# Patient Record
Sex: Female | Born: 1970 | Race: White | Hispanic: No | Marital: Married | Smoking: Former smoker
Health system: Southern US, Community
[De-identification: ages and names within clinical notes are randomized; demographics above are authoritative.]

## PROBLEM LIST (undated history)

## (undated) DIAGNOSIS — F439 Reaction to severe stress, unspecified: Secondary | ICD-10-CM

## (undated) DIAGNOSIS — I10 Essential (primary) hypertension: Secondary | ICD-10-CM

## (undated) HISTORY — PX: GALLBLADDER SURGERY: SHX652

## (undated) HISTORY — DX: Reaction to severe stress, unspecified: F43.9

---

## 1998-06-25 ENCOUNTER — Other Ambulatory Visit: Admission: RE | Admit: 1998-06-25 | Discharge: 1998-06-25 | Payer: Self-pay | Admitting: Obstetrics and Gynecology

## 1999-01-25 ENCOUNTER — Emergency Department (HOSPITAL_COMMUNITY): Admission: EM | Admit: 1999-01-25 | Discharge: 1999-01-25 | Payer: Self-pay | Admitting: Emergency Medicine

## 1999-05-14 ENCOUNTER — Other Ambulatory Visit: Admission: RE | Admit: 1999-05-14 | Discharge: 1999-05-14 | Payer: Self-pay | Admitting: *Deleted

## 2000-11-30 ENCOUNTER — Other Ambulatory Visit: Admission: RE | Admit: 2000-11-30 | Discharge: 2000-11-30 | Payer: Self-pay | Admitting: Obstetrics and Gynecology

## 2001-02-02 ENCOUNTER — Encounter: Payer: Self-pay | Admitting: Obstetrics and Gynecology

## 2001-02-02 ENCOUNTER — Ambulatory Visit (HOSPITAL_COMMUNITY): Admission: RE | Admit: 2001-02-02 | Discharge: 2001-02-02 | Payer: Self-pay | Admitting: Obstetrics and Gynecology

## 2001-04-11 ENCOUNTER — Encounter: Payer: Self-pay | Admitting: Obstetrics and Gynecology

## 2001-04-11 ENCOUNTER — Ambulatory Visit (HOSPITAL_COMMUNITY): Admission: RE | Admit: 2001-04-11 | Discharge: 2001-04-11 | Payer: Self-pay | Admitting: Obstetrics and Gynecology

## 2001-06-17 ENCOUNTER — Inpatient Hospital Stay (HOSPITAL_COMMUNITY): Admission: AD | Admit: 2001-06-17 | Discharge: 2001-06-19 | Payer: Self-pay | Admitting: Obstetrics and Gynecology

## 2001-11-23 ENCOUNTER — Other Ambulatory Visit: Admission: RE | Admit: 2001-11-23 | Discharge: 2001-11-23 | Payer: Self-pay | Admitting: Obstetrics and Gynecology

## 2002-04-06 ENCOUNTER — Encounter: Payer: Self-pay | Admitting: Obstetrics and Gynecology

## 2002-04-06 ENCOUNTER — Ambulatory Visit (HOSPITAL_COMMUNITY): Admission: RE | Admit: 2002-04-06 | Discharge: 2002-04-06 | Payer: Self-pay | Admitting: Obstetrics and Gynecology

## 2002-07-27 ENCOUNTER — Inpatient Hospital Stay (HOSPITAL_COMMUNITY): Admission: AD | Admit: 2002-07-27 | Discharge: 2002-07-28 | Payer: Self-pay | Admitting: Obstetrics and Gynecology

## 2002-11-30 ENCOUNTER — Other Ambulatory Visit: Admission: RE | Admit: 2002-11-30 | Discharge: 2002-11-30 | Payer: Self-pay | Admitting: Obstetrics and Gynecology

## 2004-01-17 ENCOUNTER — Observation Stay (HOSPITAL_COMMUNITY): Admission: EM | Admit: 2004-01-17 | Discharge: 2004-01-18 | Payer: Self-pay | Admitting: Emergency Medicine

## 2004-01-17 ENCOUNTER — Encounter (INDEPENDENT_AMBULATORY_CARE_PROVIDER_SITE_OTHER): Payer: Self-pay | Admitting: Specialist

## 2004-02-24 ENCOUNTER — Other Ambulatory Visit: Admission: RE | Admit: 2004-02-24 | Discharge: 2004-02-24 | Payer: Self-pay | Admitting: Obstetrics and Gynecology

## 2004-03-09 ENCOUNTER — Encounter (INDEPENDENT_AMBULATORY_CARE_PROVIDER_SITE_OTHER): Payer: Self-pay | Admitting: *Deleted

## 2004-03-09 LAB — CONVERTED CEMR LAB

## 2004-03-25 ENCOUNTER — Other Ambulatory Visit: Admission: RE | Admit: 2004-03-25 | Discharge: 2004-03-25 | Payer: Self-pay | Admitting: Obstetrics and Gynecology

## 2004-04-03 ENCOUNTER — Ambulatory Visit (HOSPITAL_COMMUNITY): Admission: RE | Admit: 2004-04-03 | Discharge: 2004-04-03 | Payer: Self-pay | Admitting: Obstetrics and Gynecology

## 2004-08-21 ENCOUNTER — Inpatient Hospital Stay (HOSPITAL_COMMUNITY): Admission: AD | Admit: 2004-08-21 | Discharge: 2004-08-23 | Payer: Self-pay | Admitting: Obstetrics and Gynecology

## 2004-11-14 ENCOUNTER — Ambulatory Visit: Payer: Self-pay | Admitting: Family Medicine

## 2004-11-28 ENCOUNTER — Ambulatory Visit: Payer: Self-pay | Admitting: Sports Medicine

## 2004-11-29 ENCOUNTER — Ambulatory Visit (HOSPITAL_BASED_OUTPATIENT_CLINIC_OR_DEPARTMENT_OTHER): Admission: RE | Admit: 2004-11-29 | Discharge: 2004-11-29 | Payer: Self-pay | Admitting: Sports Medicine

## 2004-11-29 ENCOUNTER — Ambulatory Visit: Payer: Self-pay | Admitting: Internal Medicine

## 2004-12-31 ENCOUNTER — Ambulatory Visit: Payer: Self-pay | Admitting: Family Medicine

## 2005-02-06 ENCOUNTER — Ambulatory Visit: Payer: Self-pay | Admitting: Sports Medicine

## 2005-02-25 ENCOUNTER — Ambulatory Visit: Payer: Self-pay | Admitting: Family Medicine

## 2005-03-20 ENCOUNTER — Ambulatory Visit: Payer: Self-pay | Admitting: Family Medicine

## 2006-02-07 LAB — CONVERTED CEMR LAB: Pap Smear: NORMAL

## 2006-02-08 ENCOUNTER — Encounter (INDEPENDENT_AMBULATORY_CARE_PROVIDER_SITE_OTHER): Payer: Self-pay | Admitting: Family Medicine

## 2007-01-07 ENCOUNTER — Encounter (INDEPENDENT_AMBULATORY_CARE_PROVIDER_SITE_OTHER): Payer: Self-pay | Admitting: *Deleted

## 2007-03-09 ENCOUNTER — Ambulatory Visit: Payer: Self-pay | Admitting: Family Medicine

## 2007-03-09 DIAGNOSIS — E669 Obesity, unspecified: Secondary | ICD-10-CM

## 2007-03-16 ENCOUNTER — Ambulatory Visit (HOSPITAL_COMMUNITY): Admission: RE | Admit: 2007-03-16 | Discharge: 2007-03-16 | Payer: Self-pay | Admitting: Internal Medicine

## 2007-04-12 ENCOUNTER — Ambulatory Visit: Payer: Self-pay | Admitting: *Deleted

## 2007-04-12 ENCOUNTER — Encounter (INDEPENDENT_AMBULATORY_CARE_PROVIDER_SITE_OTHER): Payer: Self-pay | Admitting: Family Medicine

## 2007-04-12 LAB — CONVERTED CEMR LAB
BUN: 11 mg/dL (ref 6–23)
CO2: 24 meq/L (ref 19–32)
Calcium: 9.2 mg/dL (ref 8.4–10.5)
Chloride: 101 meq/L (ref 96–112)
Cholesterol: 170 mg/dL (ref 0–200)
Glucose, Bld: 79 mg/dL (ref 70–99)
HCT: 36.6 %
Hemoglobin: 12.4 g/dL
Iron: 46 ug/dL (ref 42–145)
MCV: 86.8 fL
Platelets: 365 10*3/uL
RBC: 4.21 M/uL
TSH: 1.264 microintl units/mL (ref 0.350–5.50)
Total CHOL/HDL Ratio: 3.5
WBC: 7.4 10*3/uL

## 2007-04-20 ENCOUNTER — Ambulatory Visit: Payer: Self-pay | Admitting: Sports Medicine

## 2007-07-28 ENCOUNTER — Telehealth (INDEPENDENT_AMBULATORY_CARE_PROVIDER_SITE_OTHER): Payer: Self-pay | Admitting: *Deleted

## 2007-09-22 ENCOUNTER — Encounter (INDEPENDENT_AMBULATORY_CARE_PROVIDER_SITE_OTHER): Payer: Self-pay | Admitting: Family Medicine

## 2008-01-03 ENCOUNTER — Ambulatory Visit: Payer: Self-pay | Admitting: Family Medicine

## 2010-03-21 ENCOUNTER — Ambulatory Visit: Payer: Self-pay | Admitting: Family Medicine

## 2010-03-21 ENCOUNTER — Encounter: Payer: Self-pay | Admitting: Family Medicine

## 2010-03-24 ENCOUNTER — Ambulatory Visit: Payer: Self-pay | Admitting: Family Medicine

## 2010-03-24 LAB — CONVERTED CEMR LAB
ALT: 23 units/L (ref 0–35)
AST: 22 units/L (ref 0–37)
BUN: 9 mg/dL (ref 6–23)
CO2: 21 meq/L (ref 19–32)
Cholesterol: 147 mg/dL (ref 0–200)
Creatinine, Ser: 0.6 mg/dL (ref 0.40–1.20)
Glucose, Bld: 92 mg/dL (ref 70–99)
HCT: 38.6 % (ref 36.0–46.0)
Hemoglobin: 12.1 g/dL (ref 12.0–15.0)
LDL Cholesterol: 77 mg/dL (ref 0–99)
MCHC: 31.3 g/dL (ref 30.0–36.0)
Potassium: 4.3 meq/L (ref 3.5–5.3)
RBC: 4.38 M/uL (ref 3.87–5.11)
Total CHOL/HDL Ratio: 3.3
Triglycerides: 131 mg/dL (ref <150)
VLDL: 26 mg/dL (ref 0–40)

## 2010-03-25 LAB — CONVERTED CEMR LAB: Pap Smear: NEGATIVE

## 2010-12-11 NOTE — Assessment & Plan Note (Signed)
Summary: READ PPD/KH  Nurse Visit   Allergies: No Known Drug Allergies  PPD Results    Date of reading: 03/24/2010    Results: < 5mm    Interpretation: negative  Orders Added: 1)  No Charge Patient Arrived (NCPA0) [NCPA0] 

## 2010-12-11 NOTE — Assessment & Plan Note (Signed)
Summary: 40yo F wellness exam   Vital Signs:  Patient profile:   40 year old female Height:      63.25 inches Weight:      265.7 pounds BMI:     46.86 Temp:     97.2 degrees F Pulse rate:   76 / minute BP sitting:   157 / 96  (right arm)  Vitals Entered By: Starleen Blue RN (Mar 21, 2010 8:45 AM)  Serial Vital Signs/Assessments:  Time      Position  BP       Pulse  Resp  Temp     By                     138/                           Marisue Ivan  MD  Comments: Unable to auscultate accurately BP obtain per palpation By: Marisue Ivan  MD   CC: fatigue Is Patient Diabetic? No Pain Assessment Patient in pain? no        Primary Care Provider:  Marisue Ivan  MD  CC:  fatigue.  History of Present Illness: 40yo F here for routine wellness visit  Concerns: Weight and chronic fatigue.  States that she had lost 35lbs in 2009 with exercise and diet but has since quit her routine and has gained all of the weight back and more.  Now she continues to feel extrememly tired but admits that her activity level is very low.  No hx of temperature instability or chronic pain.  No hx o bleeding, dark stools, fevers, night sweats.    Preventative: States she had a mammogram 3-4 years ago b/c she was concerned b/c she didn't know her family hx and friends were dx with breast cancer.  States that mammogram was nl.  No recent findings of breast mass or nodules.  Due for pap smear.  No hx of abnl paps.  Up to date on vaccinations.  Habits & Providers  Alcohol-Tobacco-Diet     Tobacco Status: quit > 6 months  Current Medications (verified): 1)  None  Allergies (verified): No Known Drug Allergies  Past History:  Past Medical History: Obesity Chronic fatigue  Past Surgical History: Cholecystectomy - 01/08/2004   Family History: Unk b/c adopted  Social History: Lives with husband, four children.   Husband is recovering alcoholic.   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   Stable and supportive environment.   No EtOH, tobacco, or recreational drugs. No regular exercise at this time.Smoking Status:  quit > 6 months  Review of Systems      See HPI  Physical Exam  General:  VS reviewed and rechecked.  Morbidly obese, NAD. Eyes:  No corneal or conjunctival inflammation noted. EOMI. Perrla. Funduscopic exam benign, without hemorrhages, exudates or papilledema. Vision grossly normal. Mouth:  Oral mucosa and oropharynx without lesions or exudates.  Teeth in good repair. Neck:  supple, full ROM, no goiter or mass  Lungs:  Normal respiratory effort, chest expands symmetrically. Lungs are clear to auscultation, no crackles or wheezes. Heart:  Normal rate and regular rhythm. S1 and S2 normal without gallop, murmur, click, rub or other extra sounds. Abdomen:  Soft, obese, NT, ND, no HSM, active BS  Genitalia:  Pelvic Exam:        External: normal female genitalia without lesions or masses  Vagina: normal without lesions or masses        Cervix: normal without lesions or masses        Adnexa: normal bimanual exam without masses or fullness        Uterus: normal by palpation        Pap smear: performed Msk:  no joint effusions or erythema Extremities:  no peripheral edema Neurologic:  no focal deficits Skin:  no new lesions Cervical Nodes:  no lymphadenopathy   Impression & Recommendations:  Problem # 1:  HEALTH MAINTENANCE EXAM (ICD-V70.0) Assessment Unchanged Pap smear performed w/ pelvic exam. Pt declines mammogram at this time. Check CMET and Lipid panel to r/o HLD and DM given risk factors (obesity).  Orders: Pap Smear-[facility] ([id number]) [facility] - Est  40-64 yrs ([id number])  Problem # 2:  FATIGUE, CHRONIC (ICD-780.79) Assessment: Unchanged Continues to experience chronic fatigue. Likely multifactorial but mainly due to sedentary life style. Discussed exercise regimen. Will check CBC for anemia and TSH and Sed  rate.  Orders: Sed Rate (ESR)-[facility] [id number]) Comp Met-[facility] [phone]) TSH-[facility] [id number]) CBC-[facility] ([id number])  Problem # 3:  OBESITY NOS (ICD-278.00) Assessment: Deteriorated Pt is morbidly obese.  BMI 46. Discussed exercise regimen. Will schedule for nutrition clinic next month.  Orders: Lipid-[facility] ([id number])  Patient Instructions: 1)  Schedule appt with Dr. Burnadette Pop in nutrition clinic in June on a Thursday afternoon (9th, 16th, or 23rd). 2)  Goal for wt loss is 1/2lb- 1lbper week. 3)  I will call you if there are any abnormal lab results.  Appended Document: 40yo F wellness exam   PPD Application    Vaccine Type: PPD    Site: left forearm    Mfr: Sanofi Pasteur    Dose: 0.1 ml    Given by: Starleen Blue RN    Exp. Date: 08/21/2011    Lot #: [id number]     Vision Screening:Left eye with correction: 20 / 20 Right eye with correction: 20 / 20 Both eyes with correction: 20 / 20        Vision Entered By: Starleen Blue RN (Mar 21, 2010 10:22 AM)  Hearing Screen  20db HL: Left  500 hz: 20db 1000 hz: 20db 2000 hz: 20db 4000 hz: 20db Right  500 hz: 20db 1000 hz: 20db 2000 hz: 20db 4000 hz: 20db   Hearing Testing Entered By: Starleen Blue RN (Mar 21, 2010 10:22 AM)     Appended Document: ESR results reviewed......Marland KitchenMarisue Ivan, MD   Laboratory Results   Blood Tests   Date/Time Received: Mar 21, 2010 9:14 AM  Date/Time Reported: Mar 21, 2010 11:00 AM   SED rate: 65 mm/hr  Comments: ...........test performed by...........Marland KitchenTerese Door, CMA

## 2011-03-27 NOTE — Op Note (Signed)
NAMEBHAVIKA, Carney                          ACCOUNT NO.:  [id number]   MEDICAL RECORD NO.:  [id number]                   PATIENT TYPE:  INP   LOCATION:  0109                                 FACILITY:  [facility]   PHYSICIAN:  Currie Paris, M.D.           DATE OF BIRTH:  [birth year]   DATE OF PROCEDURE:  01/17/2004  DATE OF DISCHARGE:                                 OPERATIVE REPORT   PREOPERATIVE DIAGNOSIS:  Acute calculus cholecystitis.   POSTOPERATIVE DIAGNOSIS:  Acute calculus cholecystitis.   OPERATION:  Laparoscopic cholecystectomy.   SURGEON:  Currie Paris, M.D.   ASSISTANT:  Dr. Earlene Plater.   ANESTHESIA:  General endotracheal.   CLINICAL HISTORY:  This patient is a 40 year old who has presented to the  emergency room with signs and symptoms of acute cholecystitis.  She had  missed a period, and her pregnancy test was positive.  Her ultrasound  suggested that she was seven weeks pregnant.  After a lengthy discussion  with the patient, we elected to proceed with emergent cholecystectomy.   DESCRIPTION OF PROCEDURE:  Patient was seen in the holding area and had no  further questions.  She was taken to the operating room and after  satisfactory general endotracheal anesthesia having been obtained, the  abdomen was prepped and draped.  Plain Marcaine 0.2 5% was injected at the  umbilical incision.  The fascia opened, and the peritoneal cavity entered  under direct vision.  A purse string was placed.  The Hasson introduced.  The abdomen was insufflated to 15.  With the abdomen insufflated, the  patient was placed in reverse Trendelenburg.  Trocars were placed in the  epigastrium and two in the right upper quadrant in the usual positions.  The  gallbladder was tense and distended and had gas.  It appeared to white bile.  With the traction on the gallbladder, I was able to open the area of the  cystic duct and identified and dissected out the cystic duct.  Once I had  the anatomy very clear, I put a clip at the gallbladder site and opened it.  There were no stones in the residual duct.  Because of the patient's  pregnancy, I elected not to proceed to cholangiogram, since the duct  appeared normal preoperatively, and she had normal liver functions.  Three  clips were placed on the stay side of the cystic duct, and it was divided.  Further dissection showed the cystic artery with a fairly edematous node  around it.  Once this was dissected out, it was clipped and divided as well.  The gallbladder was moved from below to above.  Because of the edema, just  peeled out of the gallbladder.  Once the gallbladder was just about  disconnected, we spent some time irrigating and making sure the bed of the  gallbladder was dry.  I left a little Surgicel in place.  The gallbladder  was  disconnected and brought out.  Put in a bag and brought up an umbilical  port.  The bag did rupture on pulling it out, even though we enlarged the  incision a little bit, but the gallbladder came out intact, and there was no  bile spilled.  The bag appeared intact as well.   With the umbilical port in place, we irrigated it again.  Made sure  everything was dry.  Made sure no portion or piece of the bag was  accidentally left behind.  Then this completed the case.  The lateral  trocars were removed under  direct vision.  The umbilical port was closed under direct vision using the  purse string.  The abdomen was deflated through the epigastric port.  The  skin was closed with 4-0 Monocryl, subcuticular and Dermabond.  The patient  tolerated the procedure well.  There were no operative complications.  All  counts were correct.                                               Currie Paris, M.D.    CJS/MEDQ  D:  01/17/2004  T:  01/17/2004  Job:  [id number]

## 2011-03-27 NOTE — H&P (Signed)
[facility]  Patient:    Kristen Carney, Kristen Carney                       MRN: [id number] Adm. Date:  06/17/01 Attending:  Janine Limbo, M.D. Dictator:   Nigel Bridgeman, C.N.M.                         History and Physical  HISTORY OF PRESENT ILLNESS:   Kristen Carney is a 40 year old gravida 2, para 1-0-0-1 at 38-4/7 weeks who presents with spontaneous rupture of membranes at approximately 1:45 a.m. with clear fluid noted and uterine contractions per patient report.  She has been 3 cm in the office.  Pregnancy has been remarkable for:  #1 - Patient adopted, #2 - increased body habitus, #3 - history of migraines, #4 - history of abnormal Pap, #5 - anemia.  PRENATAL LABORATORY DATA:     Blood type is O-positive, Rh-antibody negative. VDRL nonreactive.  Rubella titer positive.  Hepatitis B surface antigen negative.  GC and Chlamydia cultures were negative.  Pap was normal.  Glucose challenge was elevated at 163; that was elevated at 19 weeks.  She had a three-hour GTT that was within normal limits.  She had a normal glucola at 28 weeks.  She did have some sporadic glycosuria in the third trimester but all her dextrose sticks at followup were within normal limits.  Group B strep culture was negative at 36 weeks.  EDC of June 26, 2001 was established by last menstrual period and was in agreement with ultrasounds at 10 and 19 weeks.  HISTORY OF PRESENT PREGNANCY:  Patient entered care at approximately 10 weeks. She had an ultrasound at that first visit.  She had the previously noted to be elevated glucola at 19 weeks; she also had anemia with a hemoglobin of 9.3 noted at 19 weeks.  Her three-hour GTT was within normal limits.  Her 28-week glucola was normal.  The rest of her pregnancy was essentially uncomplicated.  OBSTETRICAL HISTORY:          In 1994, she had a vaginal birth of a female infant, weight 7 pounds 12 ounces at [redacted] weeks gestation.  She was in  labor two and a half days.  She had epidural anesthesia.  The infant did go to NICU with an elevated WBC and questionable pneumonia and remained in NICU for 10 days.  Patient had anemia with her previous pregnancy.  MEDICAL HISTORY:              She was on Depo-Provera for two years and was on Loestrin until January of 2001.  In 1995 or 1996, she had abnormal Paps and had been repeating her Paps every six months for two years.  Occasional yeast infections are reported.  She reports the usual childhood illnesses.  She has a history of acid reflux.  She was admitted to the hospital for 23-hour observation for a UTI in the year 2000.  Her only other surgery was for wisdom teeth.  She did have some nausea following that.  Her only other hospitalizations were for childbirth and the UTI.  ALLERGIES:                    She has no known medication allergies.  FAMILY HISTORY:               Patient is adopted so there is no information on  family history.  GENETIC HISTORY:              Remarkable for the father of the babys second cousins being married.  SOCIAL HISTORY:               Patient is married to the father of the baby; he is involved and supportive.  His name is Miray Mancino.  Patient is Caucasian and of the [address] faith.  She has been followed by the physicians service at [facility].  She denies any alcohol, drug or tobacco use during this pregnancy.  PHYSICAL EXAMINATION:  VITAL SIGNS:                  Stable.  Patient is afebrile.  HEENT:                        Within normal limits.  LUNGS:                        Bilateral breath sounds are clear.  HEART:                        Regular rate and rhythm without murmur.  BREASTS:                      Soft and nontender.  ABDOMEN:                      Fundal height is approximately 43 cm.  There is a fairly large panniculus noted.  Fetal monitoring shows fetal heart rate is reactive with no decelerations.  There are  mild irregular uterine contractions noted.  PELVIC:                       Patient is leaking copious amount of clear fluid.  Cervix is 3 cm, 80%, vertex at a -3 station but is applied after a large amount of leaking.  EXTREMITIES:                  Deep tendon reflexes are 2+ without clonus. There is a trace edema noted.  IMPRESSION:                   1. Intrauterine pregnancy at 38-4/7 weeks.                               2. Spontaneous rupture of membranes.                               3. Negative group B streptococcus.                               4. Minimal labor.  PLAN:                         1. Admit to birthing suite per consult with                                  Dr. Janine Limbo as attending  physician.                               2. Routine physician orders. DD:  06/17/01 TD:  06/17/01 Job: [id number]

## 2011-03-27 NOTE — H&P (Signed)
[name], [name]              ACCOUNT NO.:  [id number]   MEDICAL RECORD NO.:  [id number]          PATIENT TYPE:  INP   LOCATION:  9103                          FACILITY:  [facility]   PHYSICIAN:  Janine Limbo, M.D.DATE OF BIRTH:  [birth year]   DATE OF ADMISSION:  08/21/2004  DATE OF DISCHARGE:                                HISTORY & PHYSICAL   HISTORY OF PRESENT ILLNESS:  Ms. Kristen Carney is a 40 year old gravida 4, para 3-0-  0-2 at 38-6/7 weeks who presented with uterine contractions every 3-5  minutes for several hours.  She denied any leaking or bleeding and reports  positive fetal movement.  Pregnancy has been remarkable for:  1) Positive  group B strep.  2) History of LGA infant.  3) Advanced maternal age with  amnio decline.  4) Conception on Micronor.  5) Late to care at 17 weeks.  6)  Obesity.  7) Laparoscopic cholecystectomy March 2005 for active  cholecystitis.  While in maternity admissions, the patient originally was 3  cm, 80% vertex, -1.  She then walked and changed to 4 cm.  She is therefore  admitted for further labor care.   PRENATAL LABORATORY DATA:  Blood type is O positive.  Rh antibody is  negative.  VDRL nonreactive.  Rubella titer is positive.  Hepatitis B  surface antigen is negative.  Quadruple screen was normal.  GC and Chlamydia  cultures were negative.  Pap was normal.  Hemoglobin upon entry into  practice was 12.  It was 11.5 at 27 weeks.  EDC of August 29, 2004, was  established by ultrasound at 8 weeks and agreement with ultrasound at 18  weeks.  The patient had a one hour Glucola of 137 at 18 weeks.  She had a  normal three hour Glucola at that time.  She had another elevated one hour  Glucola but then a normal three hour GTT at 29 weeks.   HISTORY OF PRESENT ILLNESS:  The patient entered care at approximately 17  weeks.  She had an ultrasound that day to verify dating.  Her ultrasound was  at 19 weeks which showed normal growth and development and  normal  ultrasound.  She had the elevated one hour GTT at both 18 and 27 weeks, and  then she had normal three hour GTTs following.  She was referred to the  dermatologist for a truncal rash.  She declined RPR.  She had an ultrasound  at 29 weeks showing normal growth and development with estimated fetal  weight in the 75th to 90th percentile, breech presentation.  She had another  ultrasound at 36 weeks with showed growth to the 79th percentile, vertex  presentation and normal AFI.  She had a positive beta strep noted, negative  cultures.   PAST OBSTETRICAL HISTORY:  In 1994, she had a vaginal birth of a female  infant, weight 7 pounds 12 ounces, at 38 weeks.  She was in labor 48 hours.  She had epidural anesthesia.  That child was in the NICU for 10 days  secondary to ammonia.  In 2002, she had  a vaginal birth of a female infant,  weight 9 pounds 6 ounces at 38 weeks.  She was in labor 16 hours.  She had  normal spontaneous vaginal birth.  She had epidural anesthesia.  She had no  complications.  In 2003, she had a vaginal birth of a female infant, weight  6 pounds, 15 ounces at 40 weeks.  She was in labor four hours.  She had  epidural anesthesia.  She had no complications.  In each of these, her  membranes had to be ruptured artificially, and Pitocin had to be used.  She  did conceive on Micronor.   PAST MEDICAL HISTORY:  She reports the usual childhood illnesses.  The  patient has a history of gastroesophageal reflux disease.   PAST SURGICAL HISTORY:  Wisdom teeth removed in the past and a laparoscopic  cholecystectomy in March 2005.   FAMILY HISTORY:  Unknown secondary to the patient being adopted.   SOCIAL HISTORY:  The patient is married.  He is the father of the baby.  He  is involved and supportive.  His name is Fuller Song.  He is a IT trainer.  He  is also a recovering alcoholic and also has depression.  The patient denies  any alcohol, tobacco or drug use during this  pregnancy or any domestic  violence.   PHYSICAL EXAMINATION:  VITAL SIGNS:  Stable.  The patient is afebrile.  HEENT:  Within normal limits.  LUNGS:  Breath sounds are clear.  HEART:  Regular rate and rhythm without murmur.  BREASTS:  Soft and nontender.  ABDOMEN:  Fundal height is approximately 40 cm.  Estimated fetal weight is 8  to 8-1/2 pounds.  Uterine contractions are every 5 minutes, mild to moderate  quality.  PELVIC:  Cervical exam 4 cm, 80%, vertex at a -1 station.  Fetal heart rate  is reactive with no decelerations.  EXTREMITIES:  Deep tendon reflexes are 2+ without clonus.  There is a trace  of edema noted.   IMPRESSION:  1.  Intrauterine pregnancy at 38-6/7 weeks.  2.  Early labor.  3.  Positive group B strep.  4.  History of large for gestational age infant.   PLAN:  1.  Admit to birthing suite for consult with Dr. Marline Backbone as      attending physician.  2.  Routine certified nurse midwife orders.  3.  Plan group B strep prophylaxis with Ampicillin per standard dosing.  4.  Will observe closely for labor progress and request M.D. attendance at      birth on a p.r.n. basis based upon progress in labor or any other issue.      VLL/MEDQ  D:  08/22/2004  T:  08/22/2004  Job:  [id number]

## 2011-03-27 NOTE — Procedures (Signed)
Kristen Carney, Kristen Carney              ACCOUNT NO.:  [id number]   MEDICAL RECORD NO.:  [id number]          PATIENT TYPE:  OUT   LOCATION:  SLEEP CENTER                 FACILITY[facility]  [facility]   PHYSICIAN:  Clinton D. Maple Hudson, M.D. DATE OF BIRTH:  September 17, 1971   DATE OF STUDY:  11/29/2004                              NOCTURNAL POLYSOMNOGRAM   STUDY DATE:  November 29, 2004   REFERRING PHYSICIAN:  Dr. Enid Baas   INDICATION FOR STUDY:  Hypersomnia with sleep apnea.  Epworth Sleepiness  Score 10/24.  BMI 38.9.  Weight 220 pounds.   SLEEP ARCHITECTURE:  Total sleep time 383 minutes with sleep efficiency 85%.  Stage I was 5%, stage II 56%, stages III and IV 11%, REM was 28% of total  sleep time.  Sleep latency 38 minutes, REM latency 89 minutes, awake after  sleep onset 29 minutes, arousal index 10.  No sleep medications were taken.   RESPIRATORY DATA:  Respiratory disturbance index 3.9/hr reflecting 10  obstructive apneas and 15 hypopneas.  This is within the limits of normal  and does not meet diagnostic criteria for obstructive sleep apnea/hypopnea  syndrome.  Events were not positional.  REM RDI was 1.7/hr.   OXYGEN DATA:  Patient presented congested according to the technician and  was a mouth breather.  Oxygen desaturation was to a nadir of 85%, noted only  briefly.  Mean oxygen saturation through the study was 96% on room air.   CARDIAC DATA:  Normal sinus rhythm.   MOVEMENT/PARASOMNIA:  Occasional leg jerk, insignificant.   IMPRESSION/RECOMMENDATION:  1.  Occasional sleep-disordered breathing events, within normal limits, RDI      3.9/hr.  This does not meet scoring criteria for obstructive sleep apnea      syndrome and may respond to weight loss, nasal decongestants and      encouragement to sleep off the flat of the back if appropriate.  2.  She was described as congested on the study night with implication      that nasal airflow may actually have been somewhat worse than usual,  unless this is a common circumstance for this patient.    CDY/MEDQ  D:  11/30/2004 16:27:03  T:  11/30/2004 21:10:05  Job:  [id number]

## 2011-03-27 NOTE — H&P (Signed)
NAMEOSCAR, Carney                          ACCOUNT NO.:  [id number]   MEDICAL RECORD NO.:  [id number]                   PATIENT TYPE:   LOCATION:                                       FACILITY:   PHYSICIAN:  [id number]                                DATE OF BIRTH:   DATE OF ADMISSION:  07/27/2002  DATE OF DISCHARGE:                                HISTORY & PHYSICAL   HISTORY OF PRESENT ILLNESS:  The patient is a 40 year old married white  female gravida 3 para 2-0-0-2 at 15 and one-sevenths weeks by ultrasound who  presents complaining of leaking clear fluid since about 7:30 a.m. this  morning.  She reports irregular uterine contractions all night long.  She  reports positive fetal movement.  She denies any nausea, vomiting,  headaches, or visual disturbances.  She denies any nausea, vomiting,  headaches, or visual disturbances.  She is ready to proceed with labor  augmentation as soon as possible and an epidural for labor as needed.  Her  pregnancy has been followed at [facility] by the certified  nurse midwife service and has been essentially uncomplicated though at risk  for:  1. A history of LGA baby with her last pregnancy.  2. A history of fibroids.  3. Obesity.  4. Closely-spaced pregnancies.   Her group B strep is negative with this pregnancy.   OBSTETRICAL/GYNECOLOGICAL HISTORY:  She is a gravida 3 para 2-0-0-2 who  delivered a viable female infant in 36 who weighed 7 pounds 12 ounces at  [redacted] weeks gestation following a 48-hour labor.  She did deliver vaginally and  had an epidural for that delivery.  In August 2002 she delivered a viable  female infant who weighed 9 pounds 6 ounces at 38+ weeks following a 16-hour  labor with no complications, also with an epidural, and that delivery was  attended by Dr. Leonard Schwartz.  Other GYN history is  noncontributory.   ALLERGIES:  No known drug allergies.   GENERAL MEDICAL HISTORY:  She reports having  had the usual childhood  diseases.  She has no other medical problems.  She has occasional urinary  tract infections, had her wisdom teeth removed, and her hospitalization are  for childbirth and one episode of pyelonephritis several years ago.   FAMILY HISTORY:  Unknown secondary to the fact that she is adopted.   GENETIC HISTORY:  Negative.   SOCIAL HISTORY:  She is married to Fuller Song who is involved and  supportive.  They are both employed full time.  They are of the [facility]  faith.  They deny any illicit drug use, alcohol, or smoking with this  pregnancy.   PRENATAL LABORATORY DATA:  Blood type O positive, antibody screen negative.  Syphilis nonreactive.  Rubella immune.  Hepatitis B surface antigen  negative.  One-hour Glucola at 18 weeks was 120 and at 28 weeks was 127.  Her 36-week  beta strep was negative.   PHYSICAL EXAMINATION:  VITAL SIGNS:  Stable.  She is afebrile.  HEENT:  Grossly within normal limits.  HEART:  Regular rhythm and rate.  CHEST:  Clear.  BREASTS:  Soft and nontender.  ABDOMEN:  Gravid with uterine contractions that are eight to ten minutes  apart.  Her fetal heart rate is reactive and reassuring.  PELVIC:  Her cervix is 3-4 cm, 50%, vertex, -2 station, with copious clear  fluid noted, nitrazine positive.  EXTREMITIES:  Within normal limits.   ASSESSMENT:  1. Intrauterine pregnancy at term.  2. Spontaneous rupture of the membranes.  3. Negative group B strep.   PLAN:  1. Admit to labor and delivery.  2. Follow routine C.N.M. orders.  3. Place an epidural and start Pitocin augmentation.  4. Notify Dr. Estanislado Pandy of the patient's admission.     Concha Pyo. Duplantis, C.N.M.              0454    SJD/MEDQ  D:  07/27/2002  T:  07/27/2002  Job:  [id number]

## 2011-07-28 ENCOUNTER — Ambulatory Visit (INDEPENDENT_AMBULATORY_CARE_PROVIDER_SITE_OTHER): Payer: Self-pay | Admitting: Family Medicine

## 2011-07-28 ENCOUNTER — Encounter: Payer: Self-pay | Admitting: Family Medicine

## 2011-07-28 DIAGNOSIS — E8881 Metabolic syndrome: Secondary | ICD-10-CM

## 2011-07-28 DIAGNOSIS — F32A Depression, unspecified: Secondary | ICD-10-CM | POA: Insufficient documentation

## 2011-07-28 DIAGNOSIS — E669 Obesity, unspecified: Secondary | ICD-10-CM

## 2011-07-28 DIAGNOSIS — F329 Major depressive disorder, single episode, unspecified: Secondary | ICD-10-CM

## 2011-07-28 MED ORDER — CITALOPRAM HYDROBROMIDE 40 MG PO TABS: 40.0000 mg | ORAL_TABLET | Freq: Every day | ORAL | Status: DC

## 2011-07-28 NOTE — Assessment & Plan Note (Signed)
Begin SSRI, discussed side effects, not to abruptly stop

## 2011-07-28 NOTE — Patient Instructions (Signed)
Begin the citalopram 1/2 tab in the morning, make sure you eat  Bring the form in and I will complete since you had an apt today Keep walking, eating healthy and stopping the potato chips  Depression, Adolescent and Adult Depression is a true and treatable medical condition. In general there are two kinds of depression:  Depression we all experience in some form. For example depression from the death of a loved one, financial distress or natural disasters will trigger or increase depression.   Clinical depression, on the other hand, appears without an apparent cause or reason. This depression is a disease. Depression may be caused by chemical imbalance in the body and brain or may come as a response to a physical illness. Alcohol and other drugs can cause depression.  DIAGNOSIS  The diagnosis of depression is usually based upon symptoms and medical history. TREATMENT Treatments for depression fall into three categories. These are:  Drug therapy. There are many medicines that treat depression. Responses may vary and sometimes trial and error is necessary to determine the best medicines and dosage for a particular patient.   Psychotherapy, also called talking treatments, helps people resolve their problems by looking at them from a different point of view and by giving people insight into their own personal makeup. Traditional psychotherapy looks at a childhood source of a problem. Other psychotherapy will look at current conflicts and move toward solving those. If the cause of depression is drug use, counseling is available to help abstain. In time the depression will usually improve. If there were underlying causes for the chemical use, they can be addressed.   ECT (electroconvulsive therapy) or shock treatment is not as commonly used today. It is a very effective treatment for severe suicidal depression. During ECT electrical impulses are applied to the head. These impulses cause a generalized  seizure. It can be effective but causes a loss of memory for recent events. Sometimes this loss of memory may include the last several months.  Treat all depression or suicide threats as serious. Obtain professional help. Do not wait to see if serious depression will get better over time without help. Seek help for yourself or those around you. In the [place] the number to the National Suicide Help Lines With 24 Hour Help Are: 1-800-SUICIDE 973-302-3653 Document Released: 10/23/2000 Document Re-Released: 01/22/2009 [facility] Patient Information 2011 [place], [place].

## 2011-07-28 NOTE — Progress Notes (Signed)
  Subjective:    Patient ID: Kristen Carney, female    DOB: 04-Dec-1970, 40 y.o.   MRN: [id number]  HPI  Kristen Carney was last seen May 2011 for annual CPE.  Labs were done, she has normal results, with an impressive lipid profile.  She knows that she is 'very overweight', but physically feels well.  She has been depressed since her husband had an accident at work in which he was thrown by a fork lift that in turn caused head and shoulder injuries.  He has been out of work for 8 months.  They are living on workman's comp.  She substitute teaches.  She reports being sedentary for years with a diet high in sugar, mostly from soda.  She allows herself 20 oz soda day.  She walks for one hour, slowly several times a week during her son's practice.    She has a form that needs to be completed so that she can work with the [employer].  She forgot it at home, she will bring it back in tomorrow.  Review of Systems  Constitutional: Negative for activity change, appetite change, fatigue and unexpected weight change.  Respiratory: Negative for shortness of breath.   Cardiovascular: Negative for chest pain and leg swelling.  Gastrointestinal: Negative for abdominal pain.  Musculoskeletal: Negative for back pain.  Neurological: Negative for headaches.  Psychiatric/Behavioral: Positive for dysphoric mood.       Objective:   Physical Exam  Constitutional:       Obese, young female.  Very agile and mobile.  HENT:  Right Ear: External ear normal.  Left Ear: External ear normal.  Mouth/Throat: Oropharynx is clear and moist.  Eyes: Conjunctivae are normal. Pupils are equal, round, and reactive to light.  Neck: Normal range of motion. Neck supple.  Cardiovascular: Normal rate, regular rhythm and normal heart sounds.   Pulmonary/Chest: Effort normal and breath sounds normal.  Musculoskeletal: Normal range of motion. She exhibits no edema.  Lymphadenopathy:    She has no cervical adenopathy.  Psychiatric:         Cried when I asked if she was enjoying life-then endorsed depression and interest in meds.  She felt that she could not afford them, when told it was $4, she asked to be placed on an antidepressant          Assessment & Plan:

## 2011-07-28 NOTE — Assessment & Plan Note (Signed)
She has a good understanding of her problems and what she has to do, she is making active changes.  In the face of depression it is hard to turn ones life around.

## 2012-08-02 ENCOUNTER — Other Ambulatory Visit: Payer: Self-pay | Admitting: Family Medicine

## 2012-08-04 ENCOUNTER — Ambulatory Visit (INDEPENDENT_AMBULATORY_CARE_PROVIDER_SITE_OTHER): Payer: Self-pay | Admitting: Family Medicine

## 2012-08-04 ENCOUNTER — Encounter: Payer: Self-pay | Admitting: Family Medicine

## 2012-08-04 VITALS — BP 150/88 | HR 66 | Temp 97.4°F | Ht 63.0 in | Wt 264.0 lb

## 2012-08-04 DIAGNOSIS — R5381 Other malaise: Secondary | ICD-10-CM

## 2012-08-04 DIAGNOSIS — R5383 Other fatigue: Secondary | ICD-10-CM

## 2012-08-04 DIAGNOSIS — E669 Obesity, unspecified: Secondary | ICD-10-CM

## 2012-08-04 DIAGNOSIS — F329 Major depressive disorder, single episode, unspecified: Secondary | ICD-10-CM

## 2012-08-04 MED ORDER — CITALOPRAM HYDROBROMIDE 40 MG PO TABS: 40.0000 mg | ORAL_TABLET | Freq: Every day | ORAL | Status: DC

## 2012-08-04 NOTE — Patient Instructions (Addendum)
See info on getting a good night's sleep  Consider using daily exercise as mental health time to do something for yourself, work on your health, and relieve stress.  Please contact Dr. Pascal Lux, clinical psychologist, to set up an appointment.  She can be reached at [phone].    Make lab appointment for fasting labwork  Follow-up yearly or sooner if needed

## 2012-08-04 NOTE — Assessment & Plan Note (Signed)
Refilled Celexa, gave info on Dr. Pascal Lux to consider therapy for managing life stresses.  Also recommended daily exercise, gave info on sleep hygeine

## 2012-08-04 NOTE — Progress Notes (Signed)
  Subjective:    Patient ID: Kristen Carney, female    DOB: 08/04/1971, 41 y.o.   MRN: [id number]  HPI Here for follow-up of depression  Depression:  Notes much improvement since being on Celexa, has been stable for past year.  Still feeling fatigued and stressed at times with children and husband who is out of work- Environmental manager household duties and work.  Able to enjoy activities more.  Some difficulty sleeping.  Uninsured- is planning to go to [employer] [facility] but in past has not been approved.  Obesity: difficult to find time to care for self Wt Readings from Last 3 Encounters:  08/04/12 264 lb (119.75 kg)  07/28/11 267 lb (121.11 kg)  03/21/10 265 lb 11.2 oz (120.521 kg)     Review of Systems See HPI    Objective:   Physical Exam GEN: Alert & Oriented, No acute distress Neck: supple, no thyromegaly CV:  Regular Rate & Rhythm, no murmur Respiratory:  Normal work of breathing, CTAB Abd:  + BS, soft, no tenderness to palpation Ext: no pre-tibial edema        Assessment & Plan:  Declines flu vaccine

## 2012-08-04 NOTE — Assessment & Plan Note (Signed)
BP borderline, discussed approach.  Will get fasting labs, will plan to monitor and work on lifestyle modification.

## 2012-08-09 ENCOUNTER — Other Ambulatory Visit: Payer: Self-pay

## 2012-08-09 DIAGNOSIS — E669 Obesity, unspecified: Secondary | ICD-10-CM

## 2012-08-09 DIAGNOSIS — R5383 Other fatigue: Secondary | ICD-10-CM

## 2012-08-09 LAB — LIPID PANEL: Triglycerides: 107 mg/dL (ref <150)

## 2012-08-09 LAB — COMPREHENSIVE METABOLIC PANEL
BUN: 9 mg/dL (ref 6–23)
CO2: 26 mEq/L (ref 19–32)
Creat: 0.55 mg/dL (ref 0.50–1.10)
Glucose, Bld: 96 mg/dL (ref 70–99)
Total Bilirubin: 0.4 mg/dL (ref 0.3–1.2)

## 2012-08-09 LAB — CBC
Hemoglobin: 11.8 g/dL — ABNORMAL LOW (ref 12.0–15.0)
MCH: 27.8 pg (ref 26.0–34.0)
MCV: 84.5 fL (ref 78.0–100.0)
RBC: 4.25 MIL/uL (ref 3.87–5.11)

## 2012-08-09 NOTE — Progress Notes (Signed)
FLP,CMP,CBC AND TSH DONE TODAY Carnel Stegman 

## 2012-08-10 ENCOUNTER — Encounter: Payer: Self-pay | Admitting: Family Medicine

## 2012-08-10 ENCOUNTER — Other Ambulatory Visit: Payer: Self-pay

## 2013-07-30 ENCOUNTER — Encounter (HOSPITAL_COMMUNITY): Payer: Self-pay | Admitting: Emergency Medicine

## 2013-07-30 ENCOUNTER — Emergency Department (HOSPITAL_COMMUNITY): Payer: Self-pay

## 2013-07-30 ENCOUNTER — Emergency Department (HOSPITAL_COMMUNITY)
Admission: EM | Admit: 2013-07-30 | Discharge: 2013-07-31 | Disposition: A | Discharge status: Home / Self Care | Payer: Self-pay | Attending: Emergency Medicine | Admitting: Emergency Medicine

## 2013-07-30 DIAGNOSIS — Y9229 Other specified public building as the place of occurrence of the external cause: Secondary | ICD-10-CM | POA: Insufficient documentation

## 2013-07-30 DIAGNOSIS — R296 Repeated falls: Secondary | ICD-10-CM | POA: Insufficient documentation

## 2013-07-30 DIAGNOSIS — Z87891 Personal history of nicotine dependence: Secondary | ICD-10-CM | POA: Insufficient documentation

## 2013-07-30 DIAGNOSIS — Z79899 Other long term (current) drug therapy: Secondary | ICD-10-CM | POA: Insufficient documentation

## 2013-07-30 DIAGNOSIS — Y939 Activity, unspecified: Secondary | ICD-10-CM | POA: Insufficient documentation

## 2013-07-30 DIAGNOSIS — X500XXA Overexertion from strenuous movement or load, initial encounter: Secondary | ICD-10-CM | POA: Insufficient documentation

## 2013-07-30 DIAGNOSIS — S82839A Other fracture of upper and lower end of unspecified fibula, initial encounter for closed fracture: Secondary | ICD-10-CM

## 2013-07-30 DIAGNOSIS — S82899A Other fracture of unspecified lower leg, initial encounter for closed fracture: Secondary | ICD-10-CM | POA: Insufficient documentation

## 2013-07-30 NOTE — ED Provider Notes (Signed)
CSN: [id number]     Arrival date & time 07/30/13  2139 History  This chart was scribed for Emilia Beck, PA, working with Leonette Most B. Bernette Mayers, MD by Blanchard Kelch, ED Scribe. This patient was seen in room WTR5/WTR5 and the patient's care was started at 11:41 PM.    Chief Complaint  Patient presents with  . Ankle Pain    Patient is a 42 y.o. female presenting with ankle pain. The history is provided by the patient. No language interpreter was used.  Ankle Pain   HPI Comments: Kristen Carney is a 42 y.o. female who presents to the Emergency Department due to a fall that occurred yesterday when she stepped off the curb and twisted her right ankle. Constant pain to the affected area began this morning while she was at church. There is associated gradually worsening swelling to the area. The pain is worsened with movement and walking, but she reports she is able to walk. She reports decreased range of motion of her foot.   History reviewed. No pertinent past medical history. Past Surgical History  Procedure Laterality Date  . Gallbladder surgery     No family history on file. History  Substance Use Topics  . Smoking status: Former Games developer  . Smokeless tobacco: Not on file  . Alcohol Use: No   OB History   Grav Para Term Preterm Abortions TAB SAB Ect Mult Living                 Review of Systems  All other systems reviewed and are negative.    Allergies  Review of patient's allergies indicates no known allergies.  Home Medications   Current Outpatient Rx  Name  Route  Sig  Dispense  Refill  . citalopram (CELEXA) 40 MG tablet   Oral   Take 1 tablet (40 mg total) by mouth daily.   30 tablet   11    Triage Vitals: BP 155/89  Pulse 72  Temp(Src) 98.9 F (37.2 C) (Oral)  Resp 17  Ht 5\' 3"  (1.6 m)  Wt 254 lb (115.214 kg)  BMI 45.01 kg/m2  SpO2 97%  LMP 07/28/2013  Physical Exam  Nursing note and vitals reviewed. Constitutional: She is oriented to person,  place, and time. She appears well-developed and well-nourished. No distress.  HENT:  Head: Normocephalic and atraumatic.  Eyes: EOM are normal.  Neck: Neck supple. No tracheal deviation present.  Cardiovascular: Normal rate and intact distal pulses.   Pulmonary/Chest: Effort normal. No respiratory distress.  Musculoskeletal: Normal range of motion.  Right lateral malleolar edema and tenderness to palpation with overlying bruising. No obvious deformity. Range of motion limited due to swelling and pain. Patient able to move toes of right foot.   Neurological: She is alert and oriented to person, place, and time.  Sensation intact of right foot.   Skin: Skin is warm and dry.  Psychiatric: She has a normal mood and affect. Her behavior is normal.    ED Course  Procedures (including critical care time)  DIAGNOSTIC STUDIES: Oxygen Saturation is 97% on room air, adequate by my interpretation.    COORDINATION OF CARE:  11:43 PM -Will fit for a walking boot and crutches. Recommend ice and elevation with follow up to ortho. Patient verbalizes understanding and agrees with treatment plan.   Labs Review Labs Reviewed - No data to display Imaging Review Dg Ankle Complete Right  07/30/2013   CLINICAL DATA:  Twisted ankle with pain  EXAM:  RIGHT ANKLE - COMPLETE 3+ VIEW  COMPARISON:  None.  FINDINGS: There is a horizontal fracture through the lateral malleolus, without significant displacement. The ankle mortise is congruent. Smooth talar dome. Extensive soft tissue swelling around the ankle.  IMPRESSION: Acute lateral malleolus avulsion fracture without significant displacement.   Electronically Signed   By: Tiburcio Pea   On: 07/30/2013 23:25    MDM   1. Avulsion fracture of distal fibula     Patient's xray shows lateral malleolus fracture of right distal fibula. Patient will have CAM walker and crutches and instructed to be non weight bearing. Patient will have Orthopedic follow up. No  neurovascular compromise.   I personally performed the services described in this documentation, which was scribed in my presence. The recorded information has been reviewed and is accurate.    Emilia Beck, [place] 07/31/13 [phone]

## 2013-07-30 NOTE — ED Notes (Signed)
Pt presents to the ED with a complaint of ankle pain.  Pt states she stepped off the curb wrong and fell when she twisted right ankle.  Pt states that at that time she had limited pain but felt a numbness and tingling sensations that has not gone away as of today.  Pt also has developed a bruise on the posterior side of the outside of her ankle bone.

## 2013-07-31 NOTE — ED Provider Notes (Signed)
Medical screening examination/treatment/procedure(s) were performed by non-physician practitioner and as supervising physician I was immediately available for consultation/collaboration.   Charles B. Sheldon, MD 07/31/13 1755 

## 2013-08-04 ENCOUNTER — Ambulatory Visit (INDEPENDENT_AMBULATORY_CARE_PROVIDER_SITE_OTHER): Payer: Self-pay | Admitting: Family Medicine

## 2013-08-04 VITALS — BP 130/85 | Ht 63.0 in | Wt 254.0 lb

## 2013-08-04 DIAGNOSIS — S82899A Other fracture of unspecified lower leg, initial encounter for closed fracture: Secondary | ICD-10-CM

## 2013-08-04 DIAGNOSIS — S82839A Other fracture of upper and lower end of unspecified fibula, initial encounter for closed fracture: Secondary | ICD-10-CM

## 2013-08-04 NOTE — Patient Instructions (Signed)
Thank you for coming in today  Please continue to wear boot when on your feet Continue to ice 3x per day Motrin as needed Okay to come out of boot at night  Followup 2 weeks.

## 2013-08-04 NOTE — Progress Notes (Signed)
CC: Right distal fibula fracture HPI: Patient is a very pleasant 42 year old female who presents for evaluation of right distal fibula fracture that occurred on 07/29/2013. She was carrying some heavy boxes and step down and unfortunately stepped onto a great. She suffered an inversion and plantar flexion injury to her right ankle. She went to the emergency department where she had x-rays done that showed and avulsion of the distal fibula. She was placed in a Cam Walker boot and instructed to followup. She has bruising and swelling. She does continue to have some pain. She is currently taking Advil 2-3 tablets every 6-8 hours. 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 No previous injury to that ankle.  ROS: As above in the HPI. All other systems are stable or negative.  PMH: Obesity  PSH: Cholecystectomy  Social: Patient is married. She works as a Lawyer. She does not smoke or drink alcohol. Family: Family history is unknown as patient is adopted.  Allergies: No known drug allergies    OBJECTIVE: APPEARANCE:  Patient in no acute distress.The patient appeared well nourished and normally developed. HEENT: No scleral icterus. Conjunctiva non-injected Resp: Non labored Skin: No rash MSK:  Right ankle: - Severe swelling and ecchymosis on the lateral aspect of the ankle and up the lateral leg and down into the foot. - Tenderness to palpation over the lateral malleolus as well as the base of the fifth metatarsal   MSK [place]: Limited ultrasound was performed of the lateral malleolus as well as the base of the fifth metatarsal. Fracture line of the distal fibula visualized. Fifth metatarsal shows normal hyperechoic bony cortex with no interruption or fragmentation. There is no increased Doppler signal.   ASSESSMENT: #1. Right distal fibula avulsion fracture   PLAN: Continue Cam Walker boot. We did add a sport insoles with lateral post to help with conditioning  and pain relief. She may come out of the boot to sleep. Continue to ice 3 times per day. Continue Motrin as needed. Followup in 2 weeks.

## 2013-08-13 ENCOUNTER — Other Ambulatory Visit: Payer: Self-pay | Admitting: Family Medicine

## 2013-08-18 ENCOUNTER — Ambulatory Visit (INDEPENDENT_AMBULATORY_CARE_PROVIDER_SITE_OTHER): Payer: Self-pay | Admitting: Family Medicine

## 2013-08-18 ENCOUNTER — Encounter: Payer: Self-pay | Admitting: Family Medicine

## 2013-08-18 VITALS — BP 138/85 | HR 56 | Ht 63.0 in | Wt 254.0 lb

## 2013-08-18 DIAGNOSIS — S82891A Other fracture of right lower leg, initial encounter for closed fracture: Secondary | ICD-10-CM | POA: Insufficient documentation

## 2013-08-18 DIAGNOSIS — S82899A Other fracture of unspecified lower leg, initial encounter for closed fracture: Secondary | ICD-10-CM

## 2013-08-18 MED ORDER — NITROGLYCERIN 0.2 MG/HR TD PT24: MEDICATED_PATCH | TRANSDERMAL | Status: DC

## 2013-08-18 NOTE — Patient Instructions (Addendum)
Nitroglycerin Protocol   Apply 1/4 nitroglycerin patch to affected area daily.  Change position of patch within the affected area every 24 hours.  You may experience a headache during the first 1-2 weeks of using the patch, these should subside.  If you experience headaches after beginning nitroglycerin patch treatment, you may take your preferred over the counter pain reliever.  Another side effect of the nitroglycerin patch is skin irritation or rash related to patch adhesive.  Please notify our office if you develop more severe headaches or rash, and stop the patch.  Tendon healing with nitroglycerin patch may require 12 to 24 weeks depending on the extent of injury.  Men should not use if taking Viagra, Cialis, or Levitra.   Do not use if you have migraines or rosacea.   Please follow up in 2-3 weeks  Thank you for seeing Kristen Carney today!

## 2013-08-18 NOTE — Assessment & Plan Note (Signed)
Continue Cam Walker boot, as she did have re-injury about three days ago secondary to twisting.  She has neovascularization as well as edema and evident fx at the lateral malleoli of the R ankle on [place].  Will fit for ASO brace for extra support, decrease time on feet, continue to ice 3 times per day. Continue Motrin as needed. Followup in 2-3 weeks.

## 2013-08-18 NOTE — Progress Notes (Signed)
Kristen Carney is a 42 y.o. female who presents today for f/u of her R ankle avulsion fx.  Pt was initially seen in the ED on 9/21 showing an avulsion fx of her R ankle, had f/u in [facility] on 9/26 recommending CAM walker boot, Ice, ibuprofen, and f/u today.  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  Felt immediate pain, with ambulation, but did not notice any discrete swelling.     History  Smoking status  . Former Smoker  Smokeless tobacco  . Not on file    Current Outpatient Prescriptions on File Prior to Visit  Medication Sig Dispense Refill  . citalopram (CELEXA) 40 MG tablet TAKE ONE TABLET BY MOUTH EVERY DAY  30 tablet  0   No current facility-administered medications on file prior to visit.    ROS: Per HPI.  All other systems reviewed and are negative.   Physical Exam Filed Vitals:   08/18/13 0935  BP: 138/85  Pulse: 56    Physical Examination: MSK:  Right ankle:  - Severe swelling and ecchymosis on the lateral aspect of the ankle, no distal or proximal ecchymosis/edema. - Tenderness to palpation over the lateral malleolus. No TTP at the 5th M. ROM limited in both plantar/dorsiflexion Neurovascular intact distally     MSK [place]: Limited ultrasound was performed of the lateral malleolus as well as the base of the fifth metatarsal. Fracture line of the distal fibula visualized. Fifth metatarsal shows normal hyperechoic bony cortex with no interruption or fragmentation. There is increased doppler today at the lateral malleolus superior to the fx line.

## 2013-08-30 ENCOUNTER — Ambulatory Visit (INDEPENDENT_AMBULATORY_CARE_PROVIDER_SITE_OTHER): Payer: Self-pay | Admitting: Sports Medicine

## 2013-08-30 ENCOUNTER — Encounter: Payer: Self-pay | Admitting: Sports Medicine

## 2013-08-30 VITALS — BP 144/83 | HR 62 | Ht 63.0 in | Wt 254.0 lb

## 2013-08-30 DIAGNOSIS — S82899A Other fracture of unspecified lower leg, initial encounter for closed fracture: Secondary | ICD-10-CM

## 2013-08-30 DIAGNOSIS — S82839A Other fracture of upper and lower end of unspecified fibula, initial encounter for closed fracture: Secondary | ICD-10-CM

## 2013-08-30 NOTE — Progress Notes (Signed)
CC: Followup right distal fibula fracture HPI: Patient presents for followup of right distal fibula fracture today. She is wearing both a lace up ankle brace and the Cam Walker boot. Overall she feels that she is improving. She has no pain when in the boot. She does have some continued tenderness over the lateral ankle.  ROS: As above in the HPI. All other systems are stable or negative.  OBJECTIVE: APPEARANCE:  Patient in no acute distress.The patient appeared well nourished and normally developed. HEENT: No scleral icterus. Conjunctiva non-injected Resp: Non labored Skin: No rash MSK:  Full range of motion of the ankle. She does have some pain with foot inversion. She is tender over the posterior aspect of the distal fibula. Swelling is difficult to assess due to body habitus.  MSK [place]: Limited ultrasound of the distal fibula was performed in longitudinal view. This shows continued fracture line with some early calcification but without complete bridging bone.   ASSESSMENT: #1. Right distal fibula avulsion fracture, improving   PLAN: We will continue the Cam Walker boot for an additional 2 weeks. At that time she can wean out of the boot into the lace up ankle brace. She will need to continue to wear the lace up brace for an additional 3-6 months. We will see her back in 3 weeks to ensure that she is doing okay coming out of the boot and start her on rehabilitation exercises at that time.

## 2013-09-12 ENCOUNTER — Ambulatory Visit: Payer: Self-pay | Admitting: Family Medicine

## 2013-09-19 ENCOUNTER — Encounter: Payer: Self-pay | Admitting: Family Medicine

## 2013-09-19 ENCOUNTER — Ambulatory Visit (INDEPENDENT_AMBULATORY_CARE_PROVIDER_SITE_OTHER): Payer: Self-pay | Admitting: Family Medicine

## 2013-09-19 VITALS — BP 159/87 | Ht 63.0 in | Wt 254.0 lb

## 2013-09-19 DIAGNOSIS — S82899A Other fracture of unspecified lower leg, initial encounter for closed fracture: Secondary | ICD-10-CM

## 2013-09-19 DIAGNOSIS — S82839A Other fracture of upper and lower end of unspecified fibula, initial encounter for closed fracture: Secondary | ICD-10-CM

## 2013-09-19 NOTE — Progress Notes (Signed)
CC: Followup right distal fibula a avulsion fracture HPI: Patient returns for followup today. She is currently doing well. She has weaned herself out of the [employer]. She is now in a lace up ankle brace. She has no pain at the moment. However she is very concerned about whether her fracture is actually healed or not. She is now about 7 weeks out from her injury.  ROS: As above in the HPI. All other systems are stable or negative.  OBJECTIVE: APPEARANCE:  Patient in no acute distress.The patient appeared well nourished and normally developed. HEENT: No scleral icterus. Conjunctiva non-injected Resp: Non labored Skin: No rash MSK:  - Full range of motion at the ankle - Non-tender at the lateral malleolus - Strength is 5 out of 5 in the ankle - Non-antalgic gait  MSK [place]: Limited ultrasound of the lateral malleolus was performed in transverse and longitudinal views. There is evidence of good bony callus formation.   ASSESSMENT: #1. Right distal fibula avulsion fracture, well-healed   PLAN: I recommended that the patient continue to wear the lace up ankle brace at all times when she is on her feet for the next month. She may then wean herself out of the brace and use it only during activity and exercise such as hiking, running, exercise class. She was given a set of home exercises to start including therapy and strengthening exercises, proprioception exercises. I did emphasize with her that the risk of reinjury is highest in the year after her injury and that the key to preventing further injury is to wear the brace during activity and also do her rehabilitation exercises diligently. We will see her back as needed if she has any difficulties.

## 2013-09-19 NOTE — Patient Instructions (Signed)
Thank you for coming in today  Continue to wear ASO brace when on feet for the next month Then only wear with exercise such as hiking, zumba, running, etc Start ankle rehab - One leg balance with increasing difficulty - 3 x 30 sec - Ankle strengthening with theraband, 3x10 in each direction - Balance and reach in each direction  Followup as needed Okay to resume activity

## 2014-08-17 ENCOUNTER — Ambulatory Visit (INDEPENDENT_AMBULATORY_CARE_PROVIDER_SITE_OTHER): Payer: 59 | Admitting: Family Medicine

## 2014-08-17 ENCOUNTER — Encounter: Payer: Self-pay | Admitting: Family Medicine

## 2014-08-17 VITALS — BP 184/70 | HR 77 | Temp 97.9°F | Ht 63.0 in | Wt 245.4 lb

## 2014-08-17 DIAGNOSIS — N898 Other specified noninflammatory disorders of vagina: Secondary | ICD-10-CM

## 2014-08-17 DIAGNOSIS — IMO0001 Reserved for inherently not codable concepts without codable children: Secondary | ICD-10-CM

## 2014-08-17 DIAGNOSIS — N9489 Other specified conditions associated with female genital organs and menstrual cycle: Secondary | ICD-10-CM

## 2014-08-17 DIAGNOSIS — N819 Female genital prolapse, unspecified: Secondary | ICD-10-CM

## 2014-08-17 DIAGNOSIS — R030 Elevated blood-pressure reading, without diagnosis of hypertension: Secondary | ICD-10-CM

## 2014-08-17 LAB — BASIC METABOLIC PANEL
BUN: 9 mg/dL (ref 6–23)
CO2: 22 mEq/L (ref 19–32)
Calcium: 9.2 mg/dL (ref 8.4–10.5)
Chloride: 104 mEq/L (ref 96–112)
Creat: 0.66 mg/dL (ref 0.50–1.10)
Glucose, Bld: 89 mg/dL (ref 70–99)
POTASSIUM: 4.2 meq/L (ref 3.5–5.3)
Sodium: 138 mEq/L (ref 135–145)

## 2014-08-17 LAB — LIPID PANEL
Cholesterol: 168 mg/dL (ref 0–200)
HDL: 49 mg/dL (ref >39)
LDL CALC: 95 mg/dL (ref 0–99)
Total CHOL/HDL Ratio: 3.4 Ratio
Triglycerides: 122 mg/dL (ref <150)
VLDL: 24 mg/dL (ref 0–40)

## 2014-08-17 LAB — CBC
HEMATOCRIT: 37.3 % (ref 36.0–46.0)
HEMOGLOBIN: 12.3 g/dL (ref 12.0–15.0)
MCH: 27.2 pg (ref 26.0–34.0)
MCHC: 33 g/dL (ref 30.0–36.0)
MCV: 82.3 fL (ref 78.0–100.0)
Platelets: 397 10*3/uL (ref 150–400)
RBC: 4.53 MIL/uL (ref 3.87–5.11)
RDW: 15.4 % (ref 11.5–15.5)
WBC: 6.1 10*3/uL (ref 4.0–10.5)

## 2014-08-17 LAB — TSH: TSH: 1.084 u[IU]/mL (ref 0.350–4.500)

## 2014-08-17 NOTE — Patient Instructions (Signed)
Thank you for coming in,   We have made the referral to gynecology. Please let me know if you are not contacted within a week.   We will call you with the results of today's labs.   Please try to check your blood pressure at least once a week.    Please feel free to call with any questions or concerns at any time, at [phone]. --Dr. Jordan LikesSchmitz

## 2014-08-17 NOTE — Assessment & Plan Note (Signed)
Prolapse most likely associated with a cystocele. Doesn't appear to be cancerous in nature.  [facility]

## 2014-08-17 NOTE — Progress Notes (Signed)
   Subjective:    Patient ID: Kristen Carney, female    DOB: [birth year], 43 y.o.   MRN: [id number]  HPI  Kristen Carney is here for fullness in her vagina and yearly physical.  She presents with a year history of having fullness in her in vaginal vault. There is no pain associated with it. History of 3rd or 4th degree perineal tear during 2nd pregnancy. This occurred 12 years ago. Has had two births since then. The mass is largest  after her cycle. It gets swollen to the point of almost protruding.  This wasn't a problem during her births after 2nd child.  Denies any surgeries in her abdomen or pelvis. No changes on menstrual cycle.  Enlarges with any valsalva maneuver.  Worse at the end of the day while walking on her feet.  Doesn't come comletely out of the vagina.  No problems with bowel movements.  [facility] her [facility]. No recent coitus for the past 3 years.  No pain with insertion of the tampon.   Patient was adopted so doesn't know her family history. Mammogram was conducted at 43 years of age due to this. She has had normal pap smears in the past.    Current Outpatient Prescriptions on File Prior to Visit  Medication Sig Dispense Refill  . citalopram (CELEXA) 40 MG tablet TAKE ONE TABLET BY MOUTH EVERY DAY  30 tablet  0  . nitroGLYCERIN (NITRODUR - DOSED IN MG/24 HR) 0.2 mg/hr patch Apply 1/4 patch to affected area daily.  Change patch every 24 hours.  30 patch  1   No current facility-administered medications on file prior to visit.   SHx: no smoke, rarely EtOH, none illicit drugs   Health Maintenance: mammogram at 43 yo because she was adopted.   Review of Systems See HPI     Objective:   Physical Exam BP 184/70  Pulse 77  Temp(Src) 97.9 F (36.6 C) (Oral)  Ht 5\' 3"  (1.6 m)  Wt 245 lb 6.4 oz (111.313 kg)  BMI 43.48 kg/m2  LMP 08/08/2014 Gen: NAD, alert, cooperative with exam, well-appearing CV: RRR, good S1/S2, no  murmur, no edema, capillary refill brisk  Resp: CTABL, no wheezes, non-labored Abd: SNTND, BS present, Skin: no rashes, normal turgor  Neuro: no gross deficits.  Psych: good insight, alert and oriented GU: normal external genitalia, protrusion of tissue observed in vaginal vault,  increased in size with any valsalva maneuver.      Assessment & Plan:

## 2014-08-20 ENCOUNTER — Telehealth: Payer: Self-pay | Admitting: *Deleted

## 2014-08-20 NOTE — Telephone Encounter (Signed)
Spoke with patient and informed her of below 

## 2014-08-20 NOTE — Telephone Encounter (Signed)
Message copied by Farrell OursEVANS, Eloyce Bultman K on Mon Aug 20, 2014 11:08 AM ------      Message from: Myra RudeSCHMITZ, JEREMY E      Created: Sun Aug 19, 2014  8:30 PM       Please call patient and inform that all labs are normal. Thank you. ------

## 2014-09-05 ENCOUNTER — Other Ambulatory Visit: Payer: Self-pay | Admitting: Obstetrics and Gynecology

## 2014-09-06 ENCOUNTER — Other Ambulatory Visit: Payer: Self-pay | Admitting: Obstetrics and Gynecology

## 2014-09-06 DIAGNOSIS — N898 Other specified noninflammatory disorders of vagina: Secondary | ICD-10-CM

## 2014-09-14 ENCOUNTER — Ambulatory Visit
Admission: RE | Admit: 2014-09-14 | Discharge: 2014-09-14 | Disposition: A | Discharge status: Home / Self Care | Payer: 59 | Source: Ambulatory Visit | Attending: Obstetrics and Gynecology | Admitting: Obstetrics and Gynecology

## 2014-09-14 ENCOUNTER — Other Ambulatory Visit: Payer: Self-pay | Admitting: Obstetrics and Gynecology

## 2014-09-14 DIAGNOSIS — N898 Other specified noninflammatory disorders of vagina: Secondary | ICD-10-CM

## 2015-03-25 ENCOUNTER — Encounter: Payer: Self-pay | Admitting: Family Medicine

## 2015-12-03 ENCOUNTER — Other Ambulatory Visit: Payer: Self-pay

## 2015-12-03 DIAGNOSIS — Z1231 Encounter for screening mammogram for malignant neoplasm of breast: Secondary | ICD-10-CM

## 2015-12-19 ENCOUNTER — Ambulatory Visit
Admission: RE | Admit: 2015-12-19 | Discharge: 2015-12-19 | Disposition: A | Discharge status: Home / Self Care | Payer: 59 | Source: Ambulatory Visit

## 2015-12-19 DIAGNOSIS — Z1231 Encounter for screening mammogram for malignant neoplasm of breast: Secondary | ICD-10-CM

## 2016-03-04 ENCOUNTER — Encounter: Payer: Self-pay | Admitting: Family Medicine

## 2016-03-04 ENCOUNTER — Ambulatory Visit (INDEPENDENT_AMBULATORY_CARE_PROVIDER_SITE_OTHER): Payer: 59 | Admitting: Family Medicine

## 2016-03-04 VITALS — BP 148/91 | HR 80 | Temp 98.2°F | Ht 63.0 in | Wt 246.5 lb

## 2016-03-04 DIAGNOSIS — J019 Acute sinusitis, unspecified: Secondary | ICD-10-CM

## 2016-03-04 DIAGNOSIS — R42 Dizziness and giddiness: Secondary | ICD-10-CM | POA: Diagnosis not present

## 2016-03-04 MED ORDER — AZITHROMYCIN 250 MG PO TABS: ORAL_TABLET | ORAL | Status: DC

## 2016-03-04 NOTE — Progress Notes (Signed)
    Subjective   Kristen Carney is a 45 y.o. female that presents for a same day visit  1. Dizziness: Symptoms occur at night time. She states symptoms occur when she is in bed and she has the feeling that the room is spinning. Symptoms are better when she gets up. She takes Robitussin which has helped with her dizziness. She reports sneezing and rhinorrhea for the past 3 weeks with a cough. She has headaches, which have worsened since symptoms started. No ear pain but a feeling of stuffiness in her left ear. No sick contacts. No fevers.  ROS Per HPI  Social History  Substance Use Topics  . Smoking status: Former Games developermoker  . Smokeless tobacco: None  . Alcohol Use: No    No Known Allergies  Objective   BP 148/91 mmHg  Pulse 80  Temp(Src) 98.2 F (36.8 C) (Oral)  Ht 5\' 3"  (1.6 m)  Wt 246 lb 8 oz (111.812 kg)  BMI 43.68 kg/m2  LMP 02/26/2016  General: Well appearing HEENT:   Head:  Normocephalic  Eyes: Pupils equal and reactive to light/accomodation. Extraocular movements intact bilaterally. No nystagmus.   Ears: Tympanic membranes normal bilaterally.  Nose/Throat: Nares patent bilaterally. Oropharnx clear and moist.  Neck: No cervical adenopathy bilaterally Respiratory/Chest: Clear to auscultation bilaterally. Unlabored work of breathing. No wheezing or rales.  Assessment and Plan   1. Acute sinusitis, recurrence not specified, unspecified location Since she has had symptoms for three weeks with no improvement with antihistamines, will treat with Azithromycin for 5 days - azithromycin (ZITHROMAX) 250 MG tablet; 500mg  (2 tablets) today, then 1 tablet for four days starting tomorrow.  Dispense: 6 each; Refill: 0

## 2016-03-04 NOTE — Patient Instructions (Signed)
Thank you for coming to see me today. It was a pleasure. Today we talked about:   Dizziness: this could be secondary to a sinus issue. I will prescribe Azithromycin for 5 days. If no improvement, please let me know.   If you have any questions or concerns, please do not hesitate to call the office at [phone].  Sincerely,  Jacquelin Hawkingalph Manie Bealer, MD

## 2017-11-14 ENCOUNTER — Emergency Department (HOSPITAL_COMMUNITY)
Admission: EM | Admit: 2017-11-14 | Discharge: 2017-11-14 | Disposition: A | Discharge status: Home / Self Care | Payer: 59 | Attending: Emergency Medicine | Admitting: Emergency Medicine

## 2017-11-14 ENCOUNTER — Emergency Department (HOSPITAL_COMMUNITY): Payer: 59

## 2017-11-14 ENCOUNTER — Encounter (HOSPITAL_COMMUNITY): Payer: Self-pay | Admitting: Emergency Medicine

## 2017-11-14 DIAGNOSIS — I10 Essential (primary) hypertension: Secondary | ICD-10-CM | POA: Diagnosis not present

## 2017-11-14 DIAGNOSIS — R51 Headache: Secondary | ICD-10-CM | POA: Diagnosis not present

## 2017-11-14 DIAGNOSIS — R079 Chest pain, unspecified: Secondary | ICD-10-CM | POA: Diagnosis not present

## 2017-11-14 DIAGNOSIS — Z79899 Other long term (current) drug therapy: Secondary | ICD-10-CM | POA: Diagnosis not present

## 2017-11-14 DIAGNOSIS — Z87891 Personal history of nicotine dependence: Secondary | ICD-10-CM | POA: Insufficient documentation

## 2017-11-14 DIAGNOSIS — R519 Headache, unspecified: Secondary | ICD-10-CM

## 2017-11-14 LAB — BASIC METABOLIC PANEL
Anion gap: 7 (ref 5–15)
BUN: 12 mg/dL (ref 6–20)
CHLORIDE: 105 mmol/L (ref 101–111)
CO2: 23 mmol/L (ref 22–32)
Calcium: 8.9 mg/dL (ref 8.9–10.3)
Creatinine, Ser: 0.52 mg/dL (ref 0.44–1.00)
GFR calc Af Amer: >60 mL/min (ref >60)
GFR calc non Af Amer: >60 mL/min (ref >60)
GLUCOSE: 108 mg/dL — AB (ref 65–99)
Potassium: 3.6 mmol/L (ref 3.5–5.1)
SODIUM: 135 mmol/L (ref 135–145)

## 2017-11-14 LAB — I-STAT TROPONIN, ED
TROPONIN I, POC: 0 ng/mL (ref 0.00–0.08)
Troponin i, poc: 0 ng/mL (ref 0.00–0.08)

## 2017-11-14 LAB — CBC
HCT: 37.9 % (ref 36.0–46.0)
Hemoglobin: 12.5 g/dL (ref 12.0–15.0)
MCH: 28.5 pg (ref 26.0–34.0)
MCHC: 33 g/dL (ref 30.0–36.0)
MCV: 86.3 fL (ref 78.0–100.0)
PLATELETS: 338 10*3/uL (ref 150–400)
RBC: 4.39 MIL/uL (ref 3.87–5.11)
RDW: 13.7 % (ref 11.5–15.5)
WBC: 8.3 10*3/uL (ref 4.0–10.5)

## 2017-11-14 MED ORDER — PROCHLORPERAZINE EDISYLATE 5 MG/ML IJ SOLN
10.0000 mg | Freq: Once | INTRAMUSCULAR | Status: AC
  Administered 2017-11-14: 10 mg via INTRAVENOUS
  Filled 2017-11-14: qty 2

## 2017-11-14 MED ORDER — HYDROCHLOROTHIAZIDE 25 MG PO TABS: 25.0000 mg | ORAL_TABLET | Freq: Every day | ORAL | 0 refills | Status: DC

## 2017-11-14 MED ORDER — MORPHINE SULFATE (PF) 4 MG/ML IV SOLN
4.0000 mg | Freq: Once | INTRAVENOUS | Status: AC
  Administered 2017-11-14: 4 mg via INTRAVENOUS
  Filled 2017-11-14: qty 1

## 2017-11-14 MED ORDER — LABETALOL HCL 5 MG/ML IV SOLN
20.0000 mg | Freq: Once | INTRAVENOUS | Status: AC
  Administered 2017-11-14: 20 mg via INTRAVENOUS
  Filled 2017-11-14: qty 4

## 2017-11-14 MED ORDER — SODIUM CHLORIDE 0.9 % IV BOLUS (SEPSIS)
500.0000 mL | Freq: Once | INTRAVENOUS | Status: AC
  Administered 2017-11-14: 500 mL via INTRAVENOUS

## 2017-11-14 MED ORDER — DIPHENHYDRAMINE HCL 50 MG/ML IJ SOLN
12.5000 mg | Freq: Once | INTRAMUSCULAR | Status: AC
  Administered 2017-11-14: 12.5 mg via INTRAVENOUS
  Filled 2017-11-14: qty 1

## 2017-11-14 MED ORDER — KETOROLAC TROMETHAMINE 15 MG/ML IJ SOLN
15.0000 mg | Freq: Once | INTRAMUSCULAR | Status: AC
  Administered 2017-11-14: 15 mg via INTRAVENOUS
  Filled 2017-11-14: qty 1

## 2017-11-14 NOTE — ED Notes (Signed)
1 UNSUCCESSFUL ATTEMPT TO COLLECT BLOOD

## 2017-11-14 NOTE — Discharge Instructions (Addendum)
Start taking the hydrochlorothiazide medication for your blood pressure.  Follow up with your primary care doctor as discussed

## 2017-11-14 NOTE — ED Triage Notes (Signed)
Pt states she woke this morning with a migraine, numbness in hands bilaterally and chest tightness. Pt states the tingling and numbness is ongoing. Has taken ibuprofen and tylenol this morning for headache and no relief.

## 2017-11-14 NOTE — ED Provider Notes (Signed)
[facility]   CSN: [id number] Arrival date & time: 11/14/17  0739     History   Chief Complaint Chief Complaint  Patient presents with  . Numbness  . Headache  . Chest Pain    HPI Kristen Carney is a 47 y.o. female.  HPI Patient presents to the emergency room for evaluation of chest pain.  Patient states she woke up about 4 AM.  She had pressure in her chest associated with tingling in both her arms.  Patient also developed a headache that is diffuse.  She has history of migraine headaches but has never had a headache like this.  She denies any focal weakness.  She denies any speech difficulties.  She denies any vision changes.  Patient denies any history of heart problems in the past.  She denies any history of hypertension.  Last time she saw her primary doctor was several years ago. History reviewed. No pertinent past medical history.  Patient Active Problem List   Diagnosis Date Noted  . 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 Depression 07/28/2011  . OBESITY NOS 03/09/2007    Past Surgical History:  Procedure Laterality Date  . GALLBLADDER SURGERY      OB History    No data available       Home Medications    Prior to Admission medications   Medication Sig Start Date End Date Taking? Authorizing Provider  acetaminophen (TYLENOL) 500 MG tablet Take 1,000 mg by mouth daily as needed (PAIN).   Yes [provider]  ibuprofen (ADVIL,MOTRIN) 200 MG tablet Take 600 mg by mouth daily as needed.   Yes [provider]  Nutritional Supplements (JUICE PLUS FIBRE PO) Take 1 capsule by mouth daily.   Yes [provider]  azithromycin (ZITHROMAX) 250 MG tablet 500mg  (2 tablets) today, then 1 tablet for four days starting tomorrow. Patient not taking: Reported on 11/14/2017 03/04/16   Narda Bonds, MD  hydrochlorothiazide (HYDRODIURIL) 25 MG tablet Take 1 tablet (25 mg total) by mouth daily.  11/14/17   Linwood Dibbles, MD    Family History History reviewed. No pertinent family history.  Social History Social History   Tobacco Use  . Smoking status: Former Games developer  . Smokeless tobacco: Never Used  Substance Use Topics  . Alcohol use: No  . Drug use: No     Allergies   Patient has no known allergies.   Review of Systems Review of Systems  All other systems reviewed and are negative.    Physical Exam Updated Vital Signs BP (!) 145/67 (BP Location: Right Arm)   Pulse 60   Temp (!) 97.4 F (36.3 C) (Oral)   Resp 16   Ht 1.6 m (5\' 3" )   Wt 113.4 kg (250 lb)   LMP 10/31/2017 (Exact Date) Comment: patient shielded 11-14-17  SpO2 96%   BMI 44.29 kg/m   Physical Exam  Constitutional: She appears well-developed and well-nourished. No distress.  HENT:  Head: Normocephalic and atraumatic.  Right Ear: External ear normal.  Left Ear: External ear normal.  Eyes: Conjunctivae are normal. Right eye exhibits no discharge. Left eye exhibits no discharge. No scleral icterus.  Neck: Neck supple. No tracheal deviation present.  Cardiovascular: Normal rate, regular rhythm and intact distal pulses.  Pulmonary/Chest: Effort normal and breath sounds normal. No stridor. No respiratory distress. She has no wheezes. She has no rales.  Abdominal: Soft. Bowel sounds are normal. She exhibits no  distension. There is no tenderness. There is no rebound and no guarding.  Musculoskeletal: She exhibits no edema or tenderness.  Neurological: She is alert. She has normal strength. No cranial nerve deficit (no facial droop, extraocular movements intact, no slurred speech) or sensory deficit. She exhibits normal muscle tone. She displays no seizure activity. Coordination normal.  Skin: Skin is warm and dry. No rash noted.  Psychiatric: She has a normal mood and affect.  Nursing note and vitals reviewed.    ED Treatments / Results  Labs (all labs ordered are listed, but only abnormal results  are displayed) Labs Reviewed  BASIC METABOLIC PANEL - Abnormal; Notable for the following components:      Result Value   Glucose, Bld 108 (*)    All other components within normal limits  CBC  I-STAT TROPONIN, ED  I-STAT BETA HCG BLOOD, ED ([facility], [facility], [facility] ONLY)  I-STAT TROPONIN, ED    EKG  EKG Interpretation  Date/Time:  Sunday November 14 2017 08:15:09 EST Ventricular Rate:  50 PR Interval:    QRS Duration: 108 QT Interval:  473 QTC Calculation: 432 R Axis:   44 Text Interpretation:  Sinus rhythm No old tracing to compare Confirmed by Linwood DibblesKnapp, Malaiah Viramontes [phone]) on 11/14/2017 9:08:14 AM       Radiology Dg Chest 2 View  Result Date: 11/14/2017 CLINICAL DATA:  Acute chest pain and headache today. EXAM: CHEST  2 VIEW COMPARISON:  None. FINDINGS: The cardiomediastinal silhouette is unremarkable. There is no evidence of focal airspace disease, pulmonary edema, suspicious pulmonary nodule/mass, pleural effusion, or pneumothorax. No acute bony abnormalities are identified. IMPRESSION: No active cardiopulmonary disease. Electronically Signed   By: Harmon PierJeffrey  Hu M.D.   On: 11/14/2017 08:48   Ct Head Wo Contrast  Result Date: 11/14/2017 CLINICAL DATA:  Awoke this morning with a migraine, acute severe headache worst of life, numbness in hands bilaterally, EXAM: CT HEAD WITHOUT CONTRAST TECHNIQUE: Contiguous axial images were obtained from the base of the skull through the vertex without intravenous contrast. Sagittal and coronal MPR images reconstructed from axial data set. COMPARISON:  None FINDINGS: Brain: Normal ventricular morphology. No midline shift or mass effect. Normal appearance of brain parenchyma. No intracranial hemorrhage, mass lesion, evidence of acute infarction, or extra-axial fluid collection. Vascular: Unremarkable Skull: Normal appearance Sinuses/Orbits: Mucosal thickening in sphenoid sinus. Remaining visualized paranasal sinuses and mastoid air cells clear. Other: N/A IMPRESSION: No acute  intracranial abnormalities. Electronically Signed   By: Ulyses SouthwardMark  Boles M.D.   On: 11/14/2017 10:29    Procedures Procedures (including critical care time)  Medications Ordered in ED Medications  prochlorperazine (COMPAZINE) injection 10 mg (10 mg Intravenous Given 11/14/17 1013)  diphenhydrAMINE (BENADRYL) injection 12.5 mg (12.5 mg Intravenous Given 11/14/17 1012)  sodium chloride 0.9 % bolus 500 mL (0 mLs Intravenous Stopped 11/14/17 1200)  labetalol (NORMODYNE,TRANDATE) injection 20 mg (20 mg Intravenous Given 11/14/17 1205)  morphine 4 MG/ML injection 4 mg (4 mg Intravenous Given 11/14/17 1205)  ketorolac (TORADOL) 15 MG/ML injection 15 mg (15 mg Intravenous Given 11/14/17 1205)     Initial Impression / Assessment and Plan / ED Course  I have reviewed the triage vital signs and the nursing notes.  Pertinent labs & imaging results that were available during my care of the patient were reviewed by me and considered in my medical decision making (see chart for details).  Clinical Course as of Nov 14 1341  Wynelle LinkSun Nov 14, 2017  1110 BP is still very elevated.  No acute findings on CT scan.  Will give labetalol IV  [JK]  1110 CT scan done within 6 hours of onset.  No SAH.  Do not think LP is necessary.  [JK]    Clinical Course User Index [JK] Linwood Dibbles, MD  Patient presents emergency room with complaints of headache and chest pain.  She is very hypertensive here in the emergency room.  She denies.  It is possible this is related to pain but considering her hypertension and complaints I will plan on cardiac workup as well as a CT scan of the head.    Patient's laboratory tests including serial cardiac enzymes are normal.  CT scan of the brain does not show any acute abnormalities.  Patient's blood pressure is improved with treatment.  I suspect the patient does have underlying hypertension but it is certainly possible her pain and discomfort contributed to her hypertension.  Patient has not seen a primary  doctor for a few years were not really sure what her baseline blood pressure is.  I will start her on oral blood pressure medications discussed the importance of taking her blood pressure and keeping a record of it.  I will have her follow-up with her primary care doctor.  She understands return as needed for worsening symptoms.  Final Clinical Impressions(s) / ED Diagnoses   Final diagnoses:  Hypertension, unspecified type  Acute nonintractable headache, unspecified headache type    ED Discharge Orders        Ordered    hydrochlorothiazide (HYDRODIURIL) 25 MG tablet  Daily     11/14/17 1339       Linwood Dibbles, MD 11/14/17 1343

## 2017-11-24 ENCOUNTER — Ambulatory Visit: Payer: 59 | Admitting: Internal Medicine

## 2017-11-24 VITALS — BP 160/87 | HR 66 | Temp 97.7°F | Ht 63.0 in | Wt 241.4 lb

## 2017-11-24 DIAGNOSIS — Z13220 Encounter for screening for lipoid disorders: Secondary | ICD-10-CM | POA: Diagnosis not present

## 2017-11-24 DIAGNOSIS — I10 Essential (primary) hypertension: Secondary | ICD-10-CM | POA: Insufficient documentation

## 2017-11-24 DIAGNOSIS — R739 Hyperglycemia, unspecified: Secondary | ICD-10-CM | POA: Diagnosis not present

## 2017-11-24 MED ORDER — AMLODIPINE BESYLATE 10 MG PO TABS: 10.0000 mg | ORAL_TABLET | Freq: Every day | ORAL | 3 refills | Status: DC

## 2017-11-24 NOTE — Patient Instructions (Addendum)
Please start taking Norvasc.  Continue taking the hydrochlorothiazide.  Please follow-up with me in 1 week to recheck your blood pressure and to discuss medications.

## 2017-11-24 NOTE — Progress Notes (Signed)
   Kristen Carney is a 47 y.o. female presents to office today for annual physical exam examination.  Concerns today include:  CHRONIC HTN: Current Meds - HCTZ   Reports good compliance, took meds today. Tolerating well, w/o complaints. Denies CP, dyspnea, HA, edema, dizziness / lightheadedness  Last eye exam: December 2018 Last dental exam: September 2018 Last pap smear:Kristen Carney  Immunizations needed: Tetanus booster Refills needed today:   Women's Health  Periods:Regular Pelvic symptoms: vaginal discharge pain, pain  Sexual activity: yes, same partner for several years STD Screening: None  Exercise: 2 days a week  Diet: Lunch : salads, fresh meats Dinner: veggies and chicken, pork  Smoking: None  Alcohol: once a month  Drugs: None  Mood: has been on celexa in the past, would like to discuss restarting this in the future   No past medical history on file. Social History   Socioeconomic History  . Marital status: Married    Spouse name: Not on file  . Number of children: Not on file  . Years of education: Not on file  . Highest education level: Not on file  Social Needs  . Financial resource strain: Not on file  . Food insecurity - worry: Not on file  . Food insecurity - inability: Not on file  . Transportation needs - medical: Not on file  . Transportation needs - non-medical: Not on file  Occupational History  . Not on file  Tobacco Use  . Smoking status: Former Games developermoker  . Smokeless tobacco: Never Used  Substance and Sexual Activity  . Alcohol use: No  . Drug use: No  . Sexual activity: Yes  Other Topics Concern  . Not on file  Social History Narrative  . Not on file   Past Surgical History:  Procedure Laterality Date  . GALLBLADDER SURGERY     No family history on file.  ROS: Review of Systems Review of Systems  Constitutional: Negative for chills, fever and weight loss.  Eyes: Negative for pain and discharge.  Respiratory: Negative for  sputum production and shortness of breath.   Cardiovascular: Negative for chest pain.  Gastrointestinal: Negative for abdominal pain, nausea and vomiting.  Genitourinary: Negative for dysuria and urgency.  Neurological: Negative for dizziness and headaches.  Psychiatric/Behavioral: Positive for depression.    Physical exam Physical Exam  Constitutional: She is oriented to person, place, and time. She appears well-developed and well-nourished.  HENT:  Head: Normocephalic and atraumatic.  Eyes: EOM are normal. Pupils are equal, round, and reactive to light.  Neck: Normal range of motion. Neck supple.  Cardiovascular: Normal rate and regular rhythm.  Pulmonary/Chest: Effort normal and breath sounds normal.  Abdominal: Soft. Bowel sounds are normal.  Musculoskeletal: Normal range of motion.  Neurological: She is alert and oriented to person, place, and time.  Skin: Skin is warm. Capillary refill takes less than 2 seconds.  Psychiatric: She has a normal mood and affect.     Assessment/ Plan: Patient with here for annual physical exam.   Essential hypertension Diagnosed with hypertension in the ED Blood pressure still elevated today though patient has been started on hydrochlorothiazide - will start Norvasc 10 mg, continue hydrochlorothiazide 25 mg -Follow-up in 1 week for recheck of blood pressure -Screen for hyperlipidemia and diabetes given patient is obese and had slightly elevated glucose in the ED    Kristen Carney Kristen Carney PGY-3, [facility]

## 2017-11-25 LAB — LIPID PANEL
Chol/HDL Ratio: 3.3 ratio (ref 0.0–4.4)
Cholesterol, Total: 172 mg/dL (ref 100–199)
HDL: 52 mg/dL (ref >39)
LDL Calculated: 99 mg/dL (ref 0–99)
TRIGLYCERIDES: 103 mg/dL (ref 0–149)
VLDL Cholesterol Cal: 21 mg/dL (ref 5–40)

## 2017-11-25 LAB — POCT GLYCOSYLATED HEMOGLOBIN (HGB A1C): HEMOGLOBIN A1C: 5

## 2017-11-26 ENCOUNTER — Encounter: Payer: Self-pay | Admitting: Internal Medicine

## 2017-11-26 MED ORDER — HYDROCHLOROTHIAZIDE 25 MG PO TABS: 25.0000 mg | ORAL_TABLET | Freq: Every day | ORAL | 2 refills | Status: DC

## 2017-11-26 NOTE — Assessment & Plan Note (Addendum)
Diagnosed with hypertension in the ED Blood pressure still elevated today though patient has been started on hydrochlorothiazide - will start Norvasc 10 mg, continue hydrochlorothiazide 25 mg -Follow-up in 1 week for recheck of blood pressure -Screen for hyperlipidemia and diabetes given patient is obese and had slightly elevated glucose in the ED

## 2017-12-03 ENCOUNTER — Encounter: Payer: Self-pay | Admitting: Internal Medicine

## 2017-12-17 ENCOUNTER — Encounter: Payer: Self-pay | Admitting: Internal Medicine

## 2017-12-17 ENCOUNTER — Ambulatory Visit (INDEPENDENT_AMBULATORY_CARE_PROVIDER_SITE_OTHER): Payer: 59 | Admitting: Internal Medicine

## 2017-12-17 ENCOUNTER — Encounter: Payer: Self-pay | Admitting: Psychology

## 2017-12-17 VITALS — BP 157/70 | HR 74 | Temp 98.2°F | Ht 63.0 in | Wt 241.2 lb

## 2017-12-17 DIAGNOSIS — F329 Major depressive disorder, single episode, unspecified: Secondary | ICD-10-CM

## 2017-12-17 DIAGNOSIS — F32A Depression, unspecified: Secondary | ICD-10-CM

## 2017-12-17 MED ORDER — CITALOPRAM HYDROBROMIDE 20 MG PO TABS: 20.0000 mg | ORAL_TABLET | Freq: Every day | ORAL | 0 refills | Status: DC

## 2017-12-17 NOTE — Progress Notes (Signed)
Dr. Cathlean CowerMikell requested a Behavioral Health Consult.   Presenting Issue:  Patient reported feeling very overwhelmed and reported symptoms of de-motivation, lack of concentration, irritability, insomnia and uncontrollable worry. She works full time and takes care of her 3 teenage children. While her husband is supportive, he is away from the home 4 days a week for his job leaving her as the sole care-taker at home.   Duration of CURRENT symptoms:  Symptoms exacerbated in the past few months but have been present for a year.   Impact on function: Her symptoms have interfered with motivation at home and at work, disrupted sleep, and also contributed to lack of concentration at work.   What have you tried so far? Just watch tv.  Psychiatric History - Diagnoses: Depression  - Hospitalizations: none - Pharmacotherapy: Celexa but stopped taking it several years ago due to financial reasons. Will start taking it again now.   - Outpatient therapy: No  Family history of psychiatric issues:  Daughters and husband have depression.   Current and history of substance use: reported drinking 1 glass a month, denied all other.   PHQ-9:  12 and denied item 9, SI per dr. Horton FinerMickell GAD-7:  12 [facility] administered   Warmhandoff:  complete

## 2017-12-17 NOTE — Assessment & Plan Note (Signed)
Assessment / Plan / Recommendations:   Patient presented with moderate depressive symptoms (PHQ9=12) and anxiety symptoms(GAD7=12). Her affect was appropriate throughout.  [facility] listening, provided psychoeducation about depression and anxiety,  brief overview of treatment, and introduced patient to deep breathing. Patient will try this technique in the coming weeks when she becomes overwhelmed. Patient would benefit from some stress-management techniques and [facility]. She will return in 2 weeks.

## 2017-12-17 NOTE — Patient Instructions (Signed)
I will start you on 20 mg of celexa for 1 week, you can increase to 40 mg the following week. I will see you back in 1 month to see how things going.

## 2017-12-17 NOTE — Progress Notes (Signed)
   [facility] Kristen CharsAsiyah Nashali Ditmer, MD Phone: [phone]  Reason For Visit: Follow up Depression   #Depression: Symptoms: patient feels she easily annoyed because she is feeling overwhelmed  Age of onset of first mood disturbance: She states that this started about 5 years ago. Duration of CURRENT symptoms: She stopped Celexa due to insurance issues.  However she feels like she benefited significantly from taking this medication.  She still feels like she is very stressed.  She has 5 children her husband is a Naval architecttruck driver and is not here often.  She has a lot of stressors at home and financial stressors Impact on function: Feels like it moderately impacts her function she feels like she is easily annoyed with her children and does not like how stressed out she feels  Psychiatric History - Diagnoses:Depression, [facility], Patient was on generic medication  - Hospitalizations: None  - Pharmacotherapy: Celexa  - Outpatient therapy:None   Family history of psychiatric issues: Current and history of substance use: Medical conditions that might explain or contribute to symptoms:  PHQ-9:12 GAD7:12 Dictation #1 [id number]  [id number]  CSN:[id number]  Past Medical History Reviewed problem list.  Medications- reviewed and updated No additions to family history Social history- patient is a non-smoker  Objective: BP (!) 157/70 (BP Location: Right Wrist, Patient Position: Sitting, Cuff Size: Normal)   Pulse 74   Temp 98.2 F (36.8 C) (Oral)   Ht 5\' 3"  (1.6 m)   Wt 241 lb 3.2 oz (109.4 kg)   LMP 12/17/2017 (Exact Date)   SpO2 97%   BMI 42.73 kg/m  Gen: NAD, alert, cooperative with exam MSK: Normal gait and station Neuro: Strength and sensation grossly intact   Assessment/Plan: See problem based a/p  Depression PH9 and GAD 7 of 12, significant anxiety and depression.  Previously well controlled on Celexa Will restart this medication-we will start Celexa at 20 mg  patient can increase up to 40 mg Will have patient see behavioral medicine today Follow up in 1 month

## 2017-12-18 LAB — RPR: RPR: NONREACTIVE

## 2017-12-18 LAB — HIV ANTIBODY (ROUTINE TESTING W REFLEX): HIV Screen 4th Generation wRfx: NONREACTIVE

## 2017-12-21 ENCOUNTER — Encounter: Payer: Self-pay | Admitting: Internal Medicine

## 2017-12-21 NOTE — Assessment & Plan Note (Signed)
PH9 and GAD 7 of 12, significant anxiety and depression.  Previously well controlled on Celexa Will restart this medication-we will start Celexa at 20 mg patient can increase up to 40 mg Will have patient see behavioral medicine today Follow up in 1 month

## 2017-12-22 ENCOUNTER — Ambulatory Visit: Payer: 59 | Admitting: Internal Medicine

## 2017-12-31 ENCOUNTER — Ambulatory Visit (INDEPENDENT_AMBULATORY_CARE_PROVIDER_SITE_OTHER): Payer: 59 | Admitting: Psychology

## 2017-12-31 DIAGNOSIS — F439 Reaction to severe stress, unspecified: Secondary | ICD-10-CM | POA: Insufficient documentation

## 2017-12-31 DIAGNOSIS — F329 Major depressive disorder, single episode, unspecified: Secondary | ICD-10-CM

## 2017-12-31 DIAGNOSIS — F32A Depression, unspecified: Secondary | ICD-10-CM

## 2017-12-31 HISTORY — DX: Reaction to severe stress, unspecified: F43.9

## 2017-12-31 NOTE — Assessment & Plan Note (Deleted)
Assessment/Plan Recommendations:  Kristen DikeJennifer presented today with some mild depressive(PHQ9 =9, denied SI)  and anxiety symptoms (GAD7=9). She continues to experience stressors at home, however, she has experienced some reduction in concentration troubles and frustration, which she attributes to starting to take Celexa 2 weeks ago.  She has not consistently practiced deep breathing ,but reported when she did on occasion it was helpful.

## 2017-12-31 NOTE — Progress Notes (Signed)
Reason for follow-up:  [facility] and some mild mood symptoms.   Issues discussed: Kristen Carney reported improved ability to concentrate at work as well as improved ability to manage her frustration at home. She has been taking Celexa for the past couple of weeks and reported not missing any doses. Kristen Carney reported some continued stress at home and limited social support to lean on.   [facility] discussed with her some stress triggers, symptoms of stress, impacts of stress on different areas of her life and how she manages stress.

## 2018-01-06 ENCOUNTER — Other Ambulatory Visit: Payer: Self-pay | Admitting: Internal Medicine

## 2018-01-06 DIAGNOSIS — F329 Major depressive disorder, single episode, unspecified: Secondary | ICD-10-CM

## 2018-01-06 DIAGNOSIS — F32A Depression, unspecified: Secondary | ICD-10-CM

## 2018-01-10 ENCOUNTER — Other Ambulatory Visit: Payer: Self-pay | Admitting: Internal Medicine

## 2018-01-10 DIAGNOSIS — F32A Depression, unspecified: Secondary | ICD-10-CM

## 2018-01-10 DIAGNOSIS — F329 Major depressive disorder, single episode, unspecified: Secondary | ICD-10-CM

## 2018-01-10 MED ORDER — CITALOPRAM HYDROBROMIDE 20 MG PO TABS: 20.0000 mg | ORAL_TABLET | Freq: Every day | ORAL | 0 refills | Status: DC

## 2018-01-10 NOTE — Telephone Encounter (Signed)
Pt needs refill on [facility]. She said she was told at her last appointment to take 1 pill a day for the first week she started then increase to 2 pills a day after the first week, but it wasn't stated that way on the Rx sent to the pharmacy. Please advise

## 2018-01-14 ENCOUNTER — Encounter: Payer: Self-pay | Admitting: Psychology

## 2018-01-14 ENCOUNTER — Ambulatory Visit: Payer: 59 | Admitting: Psychology

## 2018-01-14 DIAGNOSIS — F439 Reaction to severe stress, unspecified: Secondary | ICD-10-CM

## 2018-01-14 NOTE — Progress Notes (Signed)
Reason for follow-up:  [facility]   Issues discussed:  Kristen Carney reported she continues to take Celexa daily, which is helping her symptoms. She also reported she is more aware of her behavior and emotions at home, which is helping her regulate her agitation and leading to improved interactions with her children.  Kristen Carney reported she continues to experience stress related to her full schedule and identified building her social circle as a priority, which she found fairly important (7 of 10) but was less confident in her ability do accomplish this goal (4-5 of 10).

## 2018-01-14 NOTE — Assessment & Plan Note (Signed)
ssessment/Plan Recommendations:  Kristen Carney presented today with some mild depressive(PHQ9 =9, denied SI)  and anxiety symptoms (GAD7=9). She continues to experience stressors at home, however, she has experienced some reduction in concentration troubles and frustration, which she attributes to starting to take Celexa 2 weeks ago.  She has not consistently practiced deep breathing but reported when she did on occasion it was helpful and [facility] explained the benefit of practicing daily even in the absence of acute stressors for body to "learn" this response. Kristen Carney expressed understanding and willingness to try.  One particular challenge for Kristen Carney is her lack of social support in time of stress given that her husband travels 4 of 7 days a week, though she reported focusing on expanding her social circle currently is a challenge given her work and family commitments.   Kristen Carney completed worksheet examining stress triggers, symptoms and influences and [facility] encouraged her to think about where she would like to make a change to help reduce her stress. [facility] weeks to work on stress management.

## 2018-01-14 NOTE — Assessment & Plan Note (Signed)
Assessment/Plan Recommendations:  Kristen Carney continues to struggle with stress related to her full-time job and taking care of her 3 children on her own for most of the week. Her depressive symptoms (PHQ9=5 denied item 9, SI) have come down to the mild range, while her anxiety symptoms (GAD7=7) remain in the moderate range.   Kristen Carney appears to be benefiting from Celexa and is also becoming more aware of her own emotional state and reactions, which are helping her regulate her emotions when she is frustrated at home. While, Kristen Carney had a hard time articulating what helps her de-stress, she did identify a desire to build her social circle, which would allow her to have some support on a more consistent basis. Kristen Carney was not very confident of her ability to start building up her friendship circle, but expressed willingness to reach out to someone in her wider circle over the next several weeks as a first step in that direction. [facility].

## 2018-01-17 ENCOUNTER — Encounter: Payer: Self-pay | Admitting: Internal Medicine

## 2018-01-17 ENCOUNTER — Ambulatory Visit (INDEPENDENT_AMBULATORY_CARE_PROVIDER_SITE_OTHER): Payer: 59 | Admitting: Internal Medicine

## 2018-01-17 DIAGNOSIS — I10 Essential (primary) hypertension: Secondary | ICD-10-CM

## 2018-01-17 DIAGNOSIS — F329 Major depressive disorder, single episode, unspecified: Secondary | ICD-10-CM | POA: Diagnosis not present

## 2018-01-17 DIAGNOSIS — Z23 Encounter for immunization: Secondary | ICD-10-CM | POA: Diagnosis not present

## 2018-01-17 DIAGNOSIS — F32A Depression, unspecified: Secondary | ICD-10-CM

## 2018-01-17 MED ORDER — AMLODIPINE BESYLATE 10 MG PO TABS: 10.0000 mg | ORAL_TABLET | Freq: Every day | ORAL | 3 refills | Status: DC

## 2018-01-17 MED ORDER — HYDROCHLOROTHIAZIDE 25 MG PO TABS: 25.0000 mg | ORAL_TABLET | Freq: Every day | ORAL | 2 refills | Status: DC

## 2018-01-17 MED ORDER — CITALOPRAM HYDROBROMIDE 20 MG PO TABS: 20.0000 mg | ORAL_TABLET | Freq: Two times a day (BID) | ORAL | 2 refills | Status: DC

## 2018-01-17 NOTE — Assessment & Plan Note (Addendum)
Significantly improved, PHQ-9 6 Has been seeing behavioral medicine  Continue Celexa 40 mg daily

## 2018-01-17 NOTE — Assessment & Plan Note (Signed)
Well-controlled overall however would like her blood pressure to be in the 120s over 80s.  Discussed with patient.  She plans on trying to lose weight. - hydrochlorothiazide (HYDRODIURIL) 25 MG tablet; Take 1 tablet (25 mg total) by mouth daily.  Dispense: 60 tablet; Refill: 2 - amLODipine (NORVASC) 10 MG tablet; Take 1 tablet (10 mg total) by mouth daily.  Dispense: 90 tablet; Refill: 3 - Follow up in 3-4 months

## 2018-01-17 NOTE — Progress Notes (Signed)
   [facility] Noralee CharsAsiyah Daishaun Ayre, MD Phone: [phone]  Reason For Visit: Follow up    # Depression  Symptoms have improved significantly since restarting Celexa.  She is taking 40 mg and she feels like this is a good dose for her.  Denies any significant depression.  She denies any suicidal ideation.  #CHRONIC HTN: Current Meds -Hydrochlorothiazide and amlodipine Reports good compliance, took meds today. Tolerating well, w/o complaints. Lifestyle -trying to reduce her salt intake and also on weight reduction with exercise and diet changes Denies CP, dyspnea, HA, edema, dizziness / lightheadedness  Past Medical History Reviewed problem list.  Medications- reviewed and updated No additions to family history Social history- patient is a non-smoker  Objective: BP 130/86   Pulse 70   Temp 98.1 F (36.7 C) (Oral)   Ht 5\' 3"  (1.6 m)   Wt 237 lb 3.2 oz (107.6 kg)   SpO2 98%   BMI 42.02 kg/m  Gen: NAD, alert, cooperative with exam Cardio: regular rate and rhythm, S1S2 heard, no murmurs appreciated Pulm: clear to auscultation bilaterally, no wheezes, rhonchi or rales GI: soft, non-tender, non-distended, bowel sounds present, no hepatomegaly, no splenomegaly Skin: dry, intact, no rashes or lesions   Assessment/Plan: See problem based a/p  Essential hypertension Well-controlled overall however would like her blood pressure to be in the 120s over 80s.  Discussed with patient.  She plans on trying to lose weight. - hydrochlorothiazide (HYDRODIURIL) 25 MG tablet; Take 1 tablet (25 mg total) by mouth daily.  Dispense: 60 tablet; Refill: 2 - amLODipine (NORVASC) 10 MG tablet; Take 1 tablet (10 mg total) by mouth daily.  Dispense: 90 tablet; Refill: 3 - Follow up in 3-4 months  Depression Significantly improved, PHQ-9 6 Has been seeing behavioral medicine  Continue Celexa 40 mg daily

## 2018-01-17 NOTE — Patient Instructions (Signed)
Please follow-up for Pap smear at some point.  Otherwise I think you are doing well at your current medication regimen.  You can probably follow-up in about 3-4 months.

## 2018-02-04 ENCOUNTER — Ambulatory Visit: Payer: 59

## 2018-02-04 ENCOUNTER — Telehealth: Payer: Self-pay | Admitting: Psychology

## 2018-02-04 NOTE — Telephone Encounter (Signed)
Called patient after no show, left a voicemail and asked for call back.

## 2018-04-18 ENCOUNTER — Encounter: Payer: 59 | Admitting: Internal Medicine

## 2018-04-21 ENCOUNTER — Telehealth: Payer: Self-pay | Admitting: Internal Medicine

## 2018-04-21 NOTE — Telephone Encounter (Signed)
Pt came in office on 04-18-2018. Left a [place] form requested to fill and sign by MD. Jeanene Erballed and informed patient that the form is ready to be pick up.

## 2018-09-27 ENCOUNTER — Other Ambulatory Visit: Payer: Self-pay | Admitting: Internal Medicine

## 2018-09-27 DIAGNOSIS — F32A Depression, unspecified: Secondary | ICD-10-CM

## 2018-09-27 DIAGNOSIS — F329 Major depressive disorder, single episode, unspecified: Secondary | ICD-10-CM

## 2018-09-28 NOTE — Telephone Encounter (Signed)
Medication was sent to Dr. Cathlean CowerMikell originally.  Pt has been out of medication x 5 days.  Kayler Buckholtz, Maryjo RochesterJessica Dawn, CMA

## 2018-12-05 ENCOUNTER — Encounter: Payer: Self-pay | Admitting: Student in an Organized Health Care Education/Training Program

## 2018-12-05 ENCOUNTER — Other Ambulatory Visit: Payer: Self-pay | Admitting: *Deleted

## 2018-12-05 DIAGNOSIS — I10 Essential (primary) hypertension: Secondary | ICD-10-CM

## 2018-12-05 MED ORDER — HYDROCHLOROTHIAZIDE 25 MG PO TABS
25.0000 mg | ORAL_TABLET | Freq: Every day | ORAL | 0 refills | Status: DC
Start: 2018-12-05 — End: 2018-12-19

## 2018-12-05 NOTE — Telephone Encounter (Signed)
Can we please ask this patient to make an appointment so I can meet her, monitor her blood pressure and make sure we are still using the best medication and dose for her? Thanks! I'll refill prescription for short term until we can get appointment.

## 2018-12-06 NOTE — Telephone Encounter (Signed)
MD sent mychart message. Fleeger, Maryjo Rochester, CMA

## 2018-12-19 ENCOUNTER — Encounter: Payer: Self-pay | Admitting: Student in an Organized Health Care Education/Training Program

## 2018-12-19 ENCOUNTER — Other Ambulatory Visit: Payer: Self-pay

## 2018-12-19 ENCOUNTER — Ambulatory Visit (INDEPENDENT_AMBULATORY_CARE_PROVIDER_SITE_OTHER): Payer: 59 | Admitting: Student in an Organized Health Care Education/Training Program

## 2018-12-19 VITALS — BP 122/80 | HR 63 | Temp 98.1°F | Ht 63.0 in | Wt 244.8 lb

## 2018-12-19 DIAGNOSIS — F32A Depression, unspecified: Secondary | ICD-10-CM

## 2018-12-19 DIAGNOSIS — Z6841 Body Mass Index (BMI) 40.0 and over, adult: Secondary | ICD-10-CM

## 2018-12-19 DIAGNOSIS — F329 Major depressive disorder, single episode, unspecified: Secondary | ICD-10-CM

## 2018-12-19 DIAGNOSIS — T50905D Adverse effect of unspecified drugs, medicaments and biological substances, subsequent encounter: Secondary | ICD-10-CM

## 2018-12-19 DIAGNOSIS — Z131 Encounter for screening for diabetes mellitus: Secondary | ICD-10-CM | POA: Diagnosis not present

## 2018-12-19 DIAGNOSIS — Z13220 Encounter for screening for lipoid disorders: Secondary | ICD-10-CM | POA: Diagnosis not present

## 2018-12-19 DIAGNOSIS — I10 Essential (primary) hypertension: Secondary | ICD-10-CM | POA: Diagnosis not present

## 2018-12-19 DIAGNOSIS — N951 Menopausal and female climacteric states: Secondary | ICD-10-CM | POA: Insufficient documentation

## 2018-12-19 LAB — POCT GLYCOSYLATED HEMOGLOBIN (HGB A1C): Hemoglobin A1C: 5.3 % (ref 4.0–5.6)

## 2018-12-19 MED ORDER — HYDROCHLOROTHIAZIDE 25 MG PO TABS: 25.0000 mg | ORAL_TABLET | Freq: Every day | ORAL | 2 refills | Status: DC

## 2018-12-19 MED ORDER — AMLODIPINE BESYLATE 10 MG PO TABS: 10.0000 mg | ORAL_TABLET | Freq: Every day | ORAL | 3 refills | Status: DC

## 2018-12-19 MED ORDER — CITALOPRAM HYDROBROMIDE 20 MG PO TABS: 20.0000 mg | ORAL_TABLET | Freq: Two times a day (BID) | ORAL | 3 refills | Status: DC

## 2018-12-19 NOTE — Assessment & Plan Note (Signed)
We discussed diet and activity level Recommended adding strength training with weight bearing activity or resistance bands to increase muscle mass and bone strength.  Patient set SMART goal of using resistance bands in morning and after dinner every day for ~10 minutes. Discussed focusing on overall health and strength vs being hyper-focused on a number on the scale.

## 2018-12-19 NOTE — Patient Instructions (Addendum)
It was a pleasure to see you today!  To summarize our discussion for this visit:  Increase muscle mass with resistance bands 1 time per day  Refill medications  Testing labs for diabetes and cholesterol   Some additional health maintenance measures we should update are: . Overdue for pap smear   Please return to our clinic to see me as soon as convenient for pap smear and a 6 month check for blood pressure and weight progress check.  Call the clinic at [phone] if your symptoms worsen or you have any concerns.  Thank you for allowing me to take part in your care,  Dr. Jamelle Rushing   Thanks for choosing [facility] for your primary care.   Perimenopause  Perimenopause is the normal time of life before and after menstrual periods stop completely (menopause). Perimenopause can begin 2-8 years before menopause, and it usually lasts for 1 year after menopause. During perimenopause, the ovaries may or may not produce an egg. What are the causes? This condition is caused by a natural change in hormone levels that happens as you get older. What increases the risk? This condition is more likely to start at an earlier age if you have certain medical conditions or treatments, including:  A tumor of the pituitary gland in the brain.  A disease that affects the ovaries and hormone production.  Radiation treatment for cancer.  Certain cancer treatments, such as chemotherapy or hormone (anti-estrogen) therapy.  Heavy smoking and excessive alcohol use.  Family history of early menopause. What are the signs or symptoms? Perimenopausal changes affect each woman differently. Symptoms of this condition may include:  Hot flashes.  Night sweats.  Irregular menstrual periods.  Decreased sex drive.  Vaginal dryness.  Headaches.  Mood swings.  Depression.  Memory problems or trouble concentrating.  Irritability.  Tiredness.  Weight  gain.  Anxiety.  Trouble getting pregnant. How is this diagnosed? This condition is diagnosed based on your medical history, a physical exam, your age, your menstrual history, and your symptoms. Hormone tests may also be done. How is this treated? In some cases, no treatment is needed. You and your health care provider should make a decision together about whether treatment is necessary. Treatment will be based on your individual condition and preferences. Various treatments are available, such as:  Menopausal hormone therapy (MHT).  Medicines to treat specific symptoms.  Acupuncture.  Vitamin or herbal supplements. Before starting treatment, make sure to let your health care provider know if you have a personal or family history of:  Heart disease.  Breast cancer.  Blood clots.  Diabetes.  Osteoporosis. Follow these instructions at home: Lifestyle  Do not use any products that contain nicotine or tobacco, such as cigarettes and e-cigarettes. If you need help quitting, ask your health care provider.  Eat a balanced diet that includes fresh fruits and vegetables, whole grains, soybeans, eggs, lean meat, and low-fat dairy.  Get at least 30 minutes of physical activity on 5 or more days each week.  Avoid alcoholic and caffeinated beverages, as well as spicy foods. This may help prevent hot flashes.  Get 7-8 hours of sleep each night.  Dress in layers that can be removed to help you manage hot flashes.  Find ways to manage stress, such as deep breathing, meditation, or journaling. General instructions  Keep track of your menstrual periods, including: ? When they occur. ? How heavy they are and how long they last. ? How much time  passes between periods.  Keep track of your symptoms, noting when they start, how often you have them, and how long they last.  Take over-the-counter and prescription medicines only as told by your health care provider.  Take vitamin  supplements only as told by your health care provider. These may include calcium, vitamin E, and vitamin D.  Use vaginal lubricants or moisturizers to help with vaginal dryness and improve comfort during sex.  Talk with your health care provider before starting any herbal supplements.  Keep all follow-up visits as told by your health care provider. This is important. This includes any group therapy or counseling. Contact a health care provider if:  You have heavy vaginal bleeding or pass blood clots.  Your period lasts more than 2 days longer than normal.  Your periods are recurring sooner than 21 days.  You bleed after having sex. Get help right away if:  You have chest pain, trouble breathing, or trouble talking.  You have severe depression.  You have pain when you urinate.  You have severe headaches.  You have vision problems. Summary  Perimenopause is the time when a woman's body begins to move into menopause. This may happen naturally or as a result of other health problems or medical treatments.  Perimenopause can begin 2-8 years before menopause, and it usually lasts for 1 year after menopause.  Perimenopausal symptoms can be managed through medicines, lifestyle changes, and complementary therapies such as acupuncture. This information is not intended to replace advice given to you by your health care provider. Make sure you discuss any questions you have with your health care provider. Document Released: 12/03/2004 Document Revised: 12/01/2016 Document Reviewed: 12/01/2016 Elsevier Interactive Patient Education  2019 [employer].

## 2018-12-19 NOTE — Assessment & Plan Note (Signed)
Increase activity level and resistance training for bone health Follow up in ~3-6 months for monitoring of symptoms and need for intervention

## 2018-12-19 NOTE — Assessment & Plan Note (Addendum)
Well controlled- 122/80 in office today Continue current regimen. Refill amlodipine and HCTZ Checking BMet today

## 2018-12-19 NOTE — Progress Notes (Signed)
   Subjective:    Patient ID: Kristen Carney, female    DOB: July 24, 1971, 48 y.o.   MRN: [id number]   CC: labs for insurance and weight loss  HPI: Patient requesting labs drawn for her insurance- Hgb A1c and lipid profile. She denies family history of stroke, cardiac disease, hypercholesterolemia, or diabetes. She is fasting since midnight. Denies increased thirst or urination or dramatic weight changes.  She is concerned about her weight and would like guidance on losing weight. She does not drink sugary drinks, eats all whole foods, minimal carbs, minimal caffeine, lots of corn chips and salsa. She is active on her feet at work all day and does some hiking as a Therapist, musictroop leader. She denies any weight bearing activity. She has been about 240lbs for the past few years plus or minus 10 lbs. Denies excessive appetite or cravings.   Perimenopause- previous regular interval periods- patient had 3 months of amenorrhea and return of menstrual cycle last month which was abnormal heavy bleeding for a few days. She has associated hot flashes that have been present for ~6 months. Endorses increased sleep disturbance associated with the hot flashes.  She needs refill on amlodipine, HCTZ, and citalopram   Smoking status reviewed   ROS: pertinent noted in the HPI   Past Medical History:  Diagnosis Date  . Stress-related symptoms 12/31/2017   Assessment/Plan Recommendations:  Kristen Carney presented today with some mild depressive(PHQ9 =9, denied SI)  and anxiety symptoms (GAD7=9). She continues to experience stressors at home, however, she has experienced some reduction in concentration troubles and frustration, which she attributes to starting to take Celexa 2 weeks ago.  She has not consistently practiced deep breathing but reported when    Past Surgical History:  Procedure Laterality Date  . GALLBLADDER SURGERY      Past medical history, surgical, family, and social history reviewed and updated in the  EMR as appropriate.  Objective:  BP 122/80   Pulse 63   Temp 98.1 F (36.7 C) (Oral)   Ht 5\' 3"  (1.6 m)   Wt 244 lb 12.8 oz (111 kg)   SpO2 99%   BMI 43.36 kg/m   Vitals and nursing note reviewed  General: NAD, pleasant, able to participate in exam Cardiac: RRR, S1 S2 present. normal heart sounds, no murmurs. Respiratory: CTAB, normal effort, No wheezes, rales or rhonchi Extremities: no edema or cyanosis. Skin: warm and dry, no rashes noted Neuro: alert, no obvious focal deficits Psych: Normal affect and mood   Assessment & Plan:    OBESITY NOS We discussed diet and activity level Recommended adding strength training with weight bearing activity or resistance bands to increase muscle mass and bone strength.  Patient set SMART goal of using resistance bands in morning and after dinner every day for ~10 minutes. Discussed focusing on overall health and strength vs being hyper-focused on a number on the scale.  Essential hypertension Well controlled- 122/80 in office today Continue current regimen. Refill amlodipine and HCTZ Checking BMet today  Perimenopause Increase activity level and resistance training for bone health Follow up in ~3-6 months for monitoring of symptoms and need for intervention  Hgb A1c is 5.3% today which is increase from last year at 5.0%  Patient is overdue for a pap smear but declined today. She is open to scheduling another appointment for her pap smear soon.  She also declined her flu shot   Jamelle Rushinghelsey Takeem Krotzer, DO [facility] PGY-1

## 2018-12-20 LAB — LIPID PANEL
Chol/HDL Ratio: 3 ratio (ref 0.0–4.4)
Cholesterol, Total: 170 mg/dL (ref 100–199)
HDL: 57 mg/dL (ref >39)
LDL Calculated: 93 mg/dL (ref 0–99)
Triglycerides: 102 mg/dL (ref 0–149)
VLDL CHOLESTEROL CAL: 20 mg/dL (ref 5–40)

## 2018-12-20 LAB — BASIC METABOLIC PANEL
BUN/Creatinine Ratio: 15 (ref 9–23)
BUN: 8 mg/dL (ref 6–24)
CO2: 24 mmol/L (ref 20–29)
CREATININE: 0.55 mg/dL — AB (ref 0.57–1.00)
Calcium: 9.2 mg/dL (ref 8.7–10.2)
Chloride: 99 mmol/L (ref 96–106)
GFR calc Af Amer: 127 mL/min/{1.73_m2} (ref >59)
GFR calc non Af Amer: 110 mL/min/{1.73_m2} (ref >59)
Glucose: 88 mg/dL (ref 65–99)
Potassium: 3.9 mmol/L (ref 3.5–5.2)
SODIUM: 138 mmol/L (ref 134–144)

## 2019-01-03 ENCOUNTER — Encounter: Payer: Self-pay | Admitting: Student in an Organized Health Care Education/Training Program

## 2019-06-10 ENCOUNTER — Encounter: Payer: Self-pay | Admitting: Student in an Organized Health Care Education/Training Program

## 2019-06-19 ENCOUNTER — Ambulatory Visit (INDEPENDENT_AMBULATORY_CARE_PROVIDER_SITE_OTHER): Payer: 59 | Admitting: Family Medicine

## 2019-06-19 ENCOUNTER — Ambulatory Visit: Payer: 59 | Admitting: Family Medicine

## 2019-06-19 ENCOUNTER — Other Ambulatory Visit: Payer: Self-pay

## 2019-06-19 DIAGNOSIS — R600 Localized edema: Secondary | ICD-10-CM

## 2019-06-19 MED ORDER — VALSARTAN 40 MG PO TABS: 40.0000 mg | ORAL_TABLET | Freq: Every day | ORAL | 3 refills | Status: DC

## 2019-06-19 NOTE — Patient Instructions (Signed)
It was great to meet you today! Thank you for letting me participate in your care!  Today, we discussed your blood pressure and it is not to goal. Given you have some leg swelling I have stopped Amlodipine and started Valsartan. I would like you to continue checking your BP at home and elevating your legs at night and wearing your compression stocking during the day. Elevate your legs when you are at home and at rest as well.   Be well, [name], DO PGY-3, [facility]

## 2019-06-19 NOTE — Progress Notes (Signed)
     Subjective: No chief complaint on file.  HPI: Kristen Carney is a 48 y.o. presenting to clinic today to discuss the following:  Leg Swelling Patient states lately she has been retaining more fluid in her legs. She has had chronic leg swelling ongoing but over the last 4 months she has noticed it getting worse. She is on her feet most of the day for her job and wears her compression socks. She is not having pain or any rash on her legs. No recent dietary changes or OTC supplements or medications. She does take amlodipine daily for HTN. She denies fever, chills, SOB, difficulty breathing, pain in her legs, chest pain, abdominal pain, or vomiting. She has had several isolated episodes of vomiting and diarrhea but that has now resolved.     ROS noted in HPI.   Past Medical, Surgical, Social, and Family History Reviewed & Updated per EMR.   Pertinent Historical Findings include:   Social History   Tobacco Use  Smoking Status Former Smoker  Smokeless Tobacco Never Used    Objective: BP (!) 150/80   Pulse 81   Wt 262 lb 12.8 oz (119.2 kg)   SpO2 98%   BMI 46.55 kg/m  Vitals and nursing notes reviewed  Physical Exam Gen: Alert and Oriented x 3, NAD HEENT: Normocephalic, atraumatic CV: RRR, no murmurs, normal S1, S2 split Resp: CTAB, no wheezing, rales, or rhonchi, comfortable work of breathing Ext: no clubbing, cyanosis, bilateral +1 edema up to the knees; no pain, negative Homan's sign bilaterally Skin: warm, dry, intact, no rashes  Assessment/Plan:  Bilateral leg edema Acute on chronic. Could be from worsening venous return vs amlodipine vs CHF. CHF unlikely given no symptoms and no history of heart disease at only age 70. BP is not to goal. - Stop amlodipine as this could be contributing to her LE edema - Start ARB, originally ordered Valsartan but still on backorder so no on Losartan 25mg  daily at bed time - Return in 2 weeks and bring home BP readings while on new  medication - If BP not to goal in 2 weeks I would increase Losartan to 50mg  daily   PATIENT EDUCATION PROVIDED: See AVS    Diagnosis and plan along with any newly prescribed medication(s) were discussed in detail with this patient today. The patient verbalized understanding and agreed with the plan. Patient advised if symptoms worsen return to clinic or ER.    Meds ordered this encounter  Medications  . DISCONTD: valsartan (DIOVAN) 40 MG tablet    Sig: Take 1 tablet (40 mg total) by mouth at bedtime.    Dispense:  90 tablet    Refill:  3  Valsartan on national backorder - sent in for Losartan.   [name], DO 06/19/2019, 9:24 AM PGY-3 [place]

## 2019-06-20 ENCOUNTER — Other Ambulatory Visit: Payer: Self-pay | Admitting: Family Medicine

## 2019-06-20 ENCOUNTER — Telehealth: Payer: Self-pay | Admitting: *Deleted

## 2019-06-20 MED ORDER — LOSARTAN POTASSIUM 25 MG PO TABS: 25.0000 mg | ORAL_TABLET | Freq: Every day | ORAL | 3 refills | Status: DC

## 2019-06-20 NOTE — Telephone Encounter (Signed)
Pt called because Valsartan is on national backorder.  Please call in something else. Christen Bame, CMA

## 2019-06-20 NOTE — Progress Notes (Signed)
Valsartan on national backorder still. Will call in Losartan.

## 2019-06-21 ENCOUNTER — Encounter: Payer: Self-pay | Admitting: Family Medicine

## 2019-06-21 DIAGNOSIS — R600 Localized edema: Secondary | ICD-10-CM | POA: Insufficient documentation

## 2019-06-21 NOTE — Assessment & Plan Note (Signed)
Acute on chronic. Could be from worsening venous return vs amlodipine vs CHF. CHF unlikely given no symptoms and no history of heart disease at only age 48. BP is not to goal. - Stop amlodipine as this could be contributing to her LE edema - Start ARB, originally ordered Valsartan but still on backorder so no on Losartan 25mg  daily at bed time - Return in 2 weeks and bring home BP readings while on new medication - If BP not to goal in 2 weeks I would increase Losartan to 50mg  daily

## 2019-07-03 ENCOUNTER — Ambulatory Visit (INDEPENDENT_AMBULATORY_CARE_PROVIDER_SITE_OTHER): Payer: 59 | Admitting: Family Medicine

## 2019-07-03 ENCOUNTER — Other Ambulatory Visit: Payer: Self-pay

## 2019-07-03 VITALS — BP 130/70 | HR 75

## 2019-07-03 DIAGNOSIS — R600 Localized edema: Secondary | ICD-10-CM | POA: Diagnosis not present

## 2019-07-03 DIAGNOSIS — Z0000 Encounter for general adult medical examination without abnormal findings: Secondary | ICD-10-CM | POA: Diagnosis not present

## 2019-07-03 DIAGNOSIS — I10 Essential (primary) hypertension: Secondary | ICD-10-CM | POA: Diagnosis not present

## 2019-07-03 NOTE — Assessment & Plan Note (Signed)
Improved. Only trace edema in both legs at the ankle level. BP is controlled at today's visit. - Cont Losartan at 25mg  as she has been controlled for the past two weeks since starting this medication - Add Amlodipine to allergy list - BMET today to check potassium and kidney function

## 2019-07-03 NOTE — Patient Instructions (Signed)
It was great to see you today! Thank you for letting me participate in your care!  Today, we discussed your improved lower leg edema and your blood pressure. Your blood pressure has been well controlled at home on your new medication Losartan. Please continue taking it as prescribed. Contact your pharmacy if you need any refills.  I have ordered a screening mammogram for you as recommended for routine health care. [facility] from [facility] for an appointment and time.  Be well, [name], DO PGY-3, [facility]

## 2019-07-03 NOTE — Assessment & Plan Note (Signed)
Well controlled on Losartan 25mg , continue medication. Amlodipine stopped due to possibly causing bilateral lower leg edema.

## 2019-07-03 NOTE — Progress Notes (Signed)
     Subjective: Chief Complaint  Patient presents with  . Medication Management    HPI: Kristen Carney is a 48 y.o. presenting to clinic today to discuss the following:  F/u for HTN and Bilateral Leg Edema Patient returns for a two week follow up for bilateral LE edema that had acutely worsened. Amlodipine was stopped and Losartan was started to maintain good BP control. On recheck today her BP is controlled and she brought in the last two weeks of her morning BP readings. Her average has been 127/77 and no reading over 140/90.  Healthcare Maintenance: Advised colonoscopy this year; patient declines referral to GI today. Screening Mammogram ordered.  ROS noted in HPI.   Past Medical, Surgical, Social, and Family History Reviewed & Updated per EMR.   Pertinent Historical Findings include:   Social History   Tobacco Use  Smoking Status Former Smoker  Smokeless Tobacco Never Used   Objective: BP 130/70   Pulse 75   SpO2 99%  Vitals and nursing notes reviewed  Physical Exam Gen: Alert and Oriented x 3, NAD Ext: no clubbing, cyanosis, trace edema below the knees bilaterally Skin: warm, dry, intact, no rashes  Assessment/Plan:  Bilateral leg edema Improved. Only trace edema in both legs at the ankle level. BP is controlled at today's visit. - Cont Losartan at 25mg  as she has been controlled for the past two weeks since starting this medication - Add Amlodipine to allergy list - BMET today to check potassium and kidney function  Essential hypertension Well controlled on Losartan 25mg , continue medication. Amlodipine stopped due to possibly causing bilateral lower leg edema.   PATIENT EDUCATION PROVIDED: See AVS    Diagnosis and plan along with any newly prescribed medication(s) were discussed in detail with this patient today. The patient verbalized understanding and agreed with the plan. Patient advised if symptoms worsen return to clinic or ER.   Orders Placed  This Encounter  Procedures  . MM Digital Screening    Standing Status:   Future    Standing Expiration Date:   07/02/2020    Order Specific Question:   Reason for Exam (SYMPTOM  OR DIAGNOSIS REQUIRED)    Answer:   screening    Order Specific Question:   Is the patient pregnant?    Answer:   No    Order Specific Question:   Preferred imaging location?    Answer:   [facility]  . Basic Metabolic Panel    No orders of the defined types were placed in this encounter.  [name], DO 07/03/2019, 1:38 PM PGY-3 [place]

## 2019-07-04 LAB — BASIC METABOLIC PANEL
BUN/Creatinine Ratio: 11 (ref 9–23)
BUN: 6 mg/dL (ref 6–24)
CO2: 21 mmol/L (ref 20–29)
Calcium: 8.9 mg/dL (ref 8.7–10.2)
Chloride: 101 mmol/L (ref 96–106)
Creatinine, Ser: 0.54 mg/dL — ABNORMAL LOW (ref 0.57–1.00)
GFR calc Af Amer: 127 mL/min/{1.73_m2} (ref >59)
GFR calc non Af Amer: 110 mL/min/{1.73_m2} (ref >59)
Glucose: 97 mg/dL (ref 65–99)
Potassium: 4.1 mmol/L (ref 3.5–5.2)
Sodium: 139 mmol/L (ref 134–144)

## 2019-08-15 ENCOUNTER — Other Ambulatory Visit: Payer: Self-pay

## 2019-08-15 ENCOUNTER — Ambulatory Visit
Admission: RE | Admit: 2019-08-15 | Discharge: 2019-08-15 | Disposition: A | Discharge status: Home / Self Care | Payer: 59 | Source: Ambulatory Visit | Attending: Family Medicine | Admitting: Family Medicine

## 2019-08-15 DIAGNOSIS — Z0000 Encounter for general adult medical examination without abnormal findings: Secondary | ICD-10-CM

## 2020-01-23 ENCOUNTER — Other Ambulatory Visit: Payer: Self-pay | Admitting: Student in an Organized Health Care Education/Training Program

## 2020-01-23 DIAGNOSIS — I10 Essential (primary) hypertension: Secondary | ICD-10-CM

## 2020-01-23 DIAGNOSIS — F329 Major depressive disorder, single episode, unspecified: Secondary | ICD-10-CM

## 2020-01-23 DIAGNOSIS — F32A Depression, unspecified: Secondary | ICD-10-CM

## 2020-02-21 IMAGING — MG MM DIGITAL SCREENING BILAT W/ CAD
4 series · 4 of 4 positions shown · non-contrast
Comparison: Previous exam(s).

CLINICAL DATA: Screening.

EXAM:
DIGITAL SCREENING BILATERAL MAMMOGRAM WITH CAD

[L MLO]
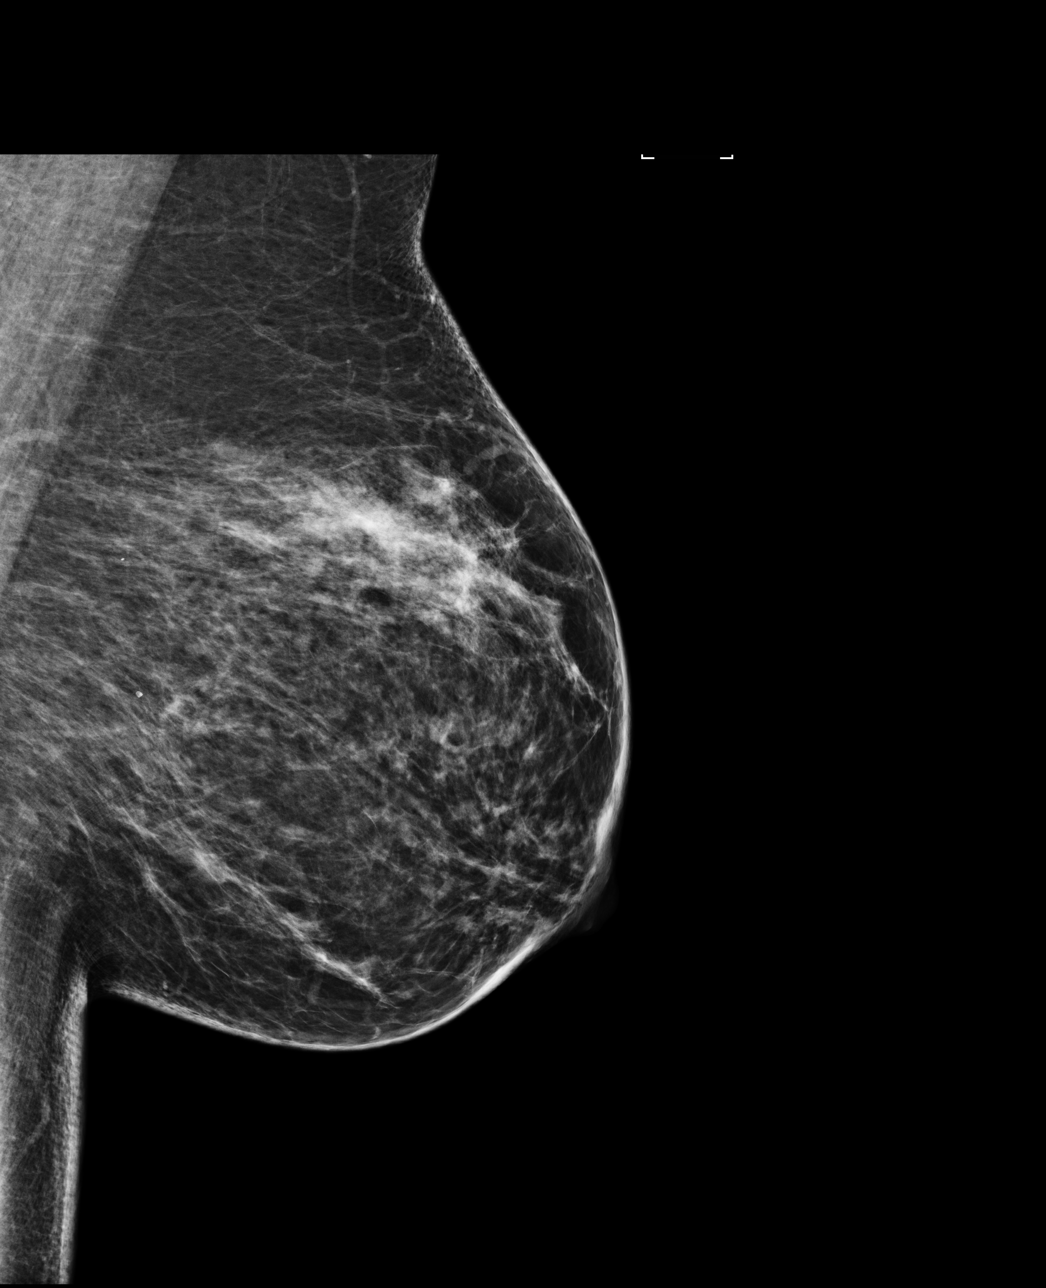

[R MLO]
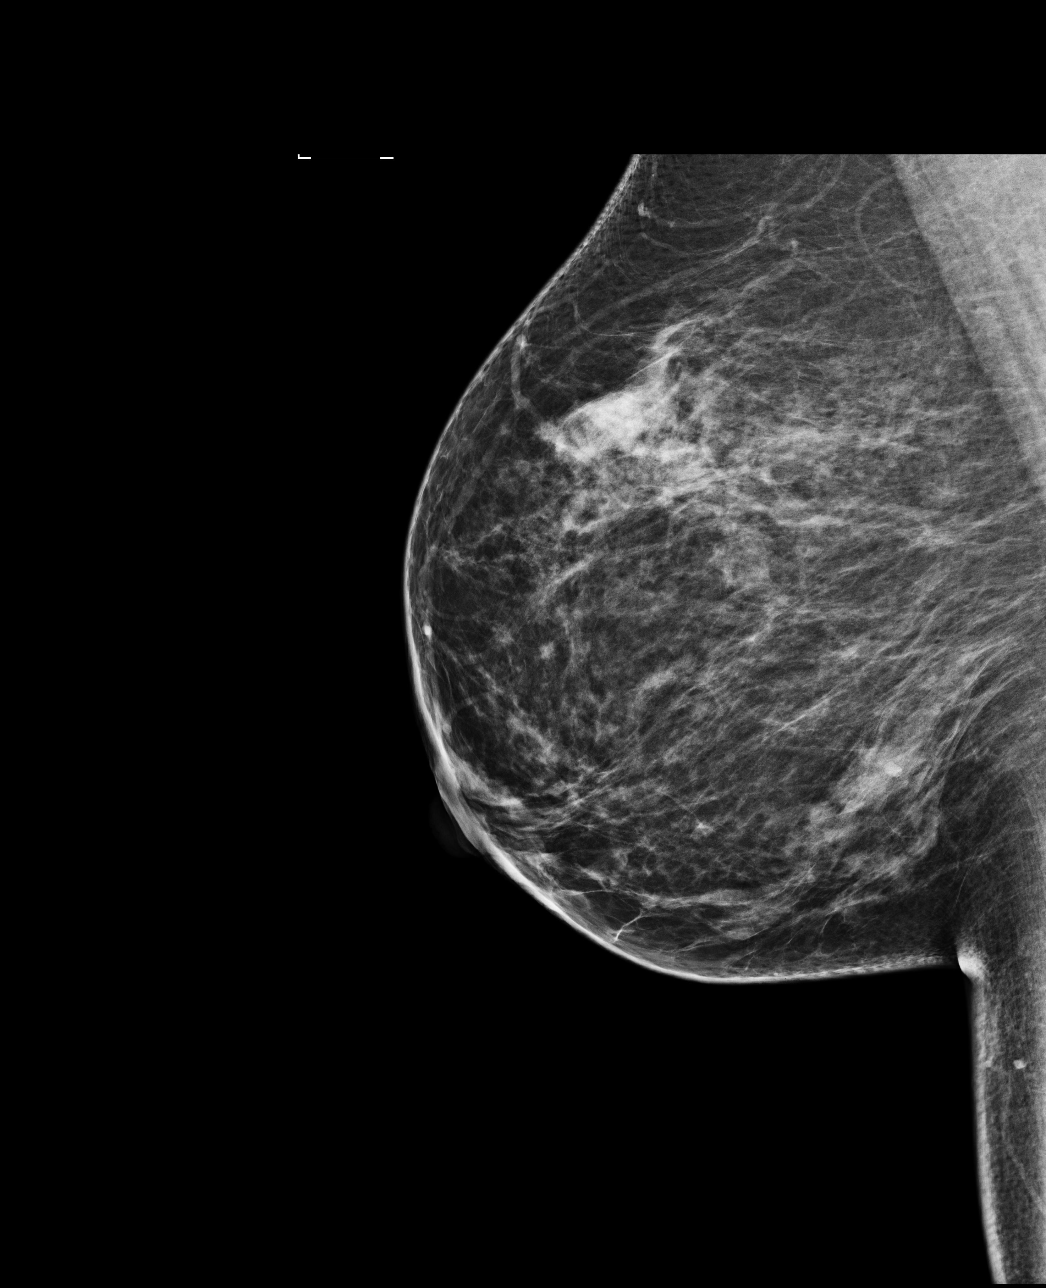

[R CC]
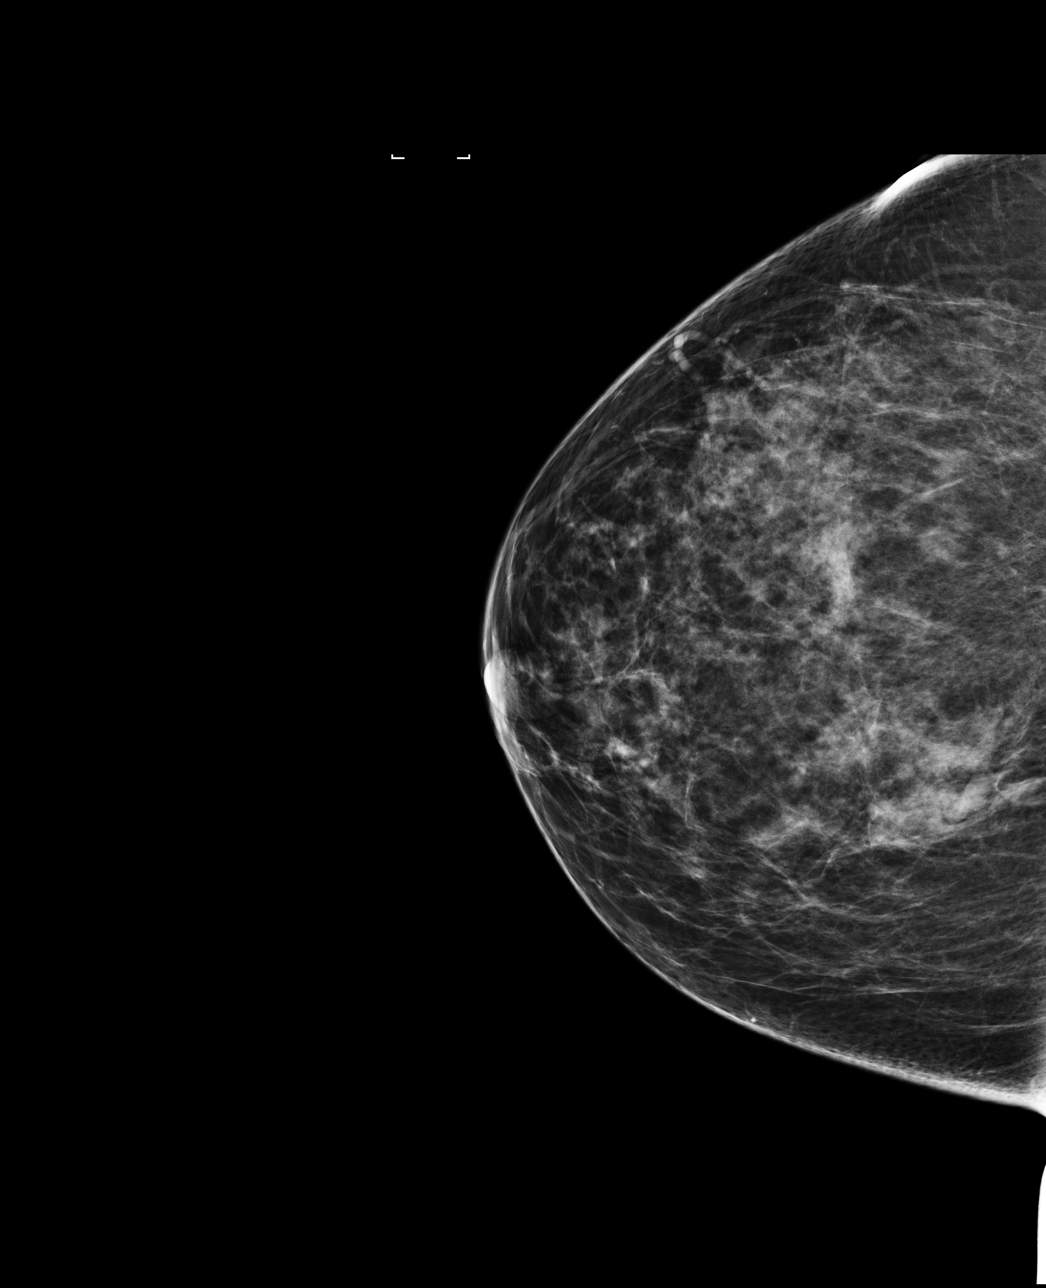

[L CC]
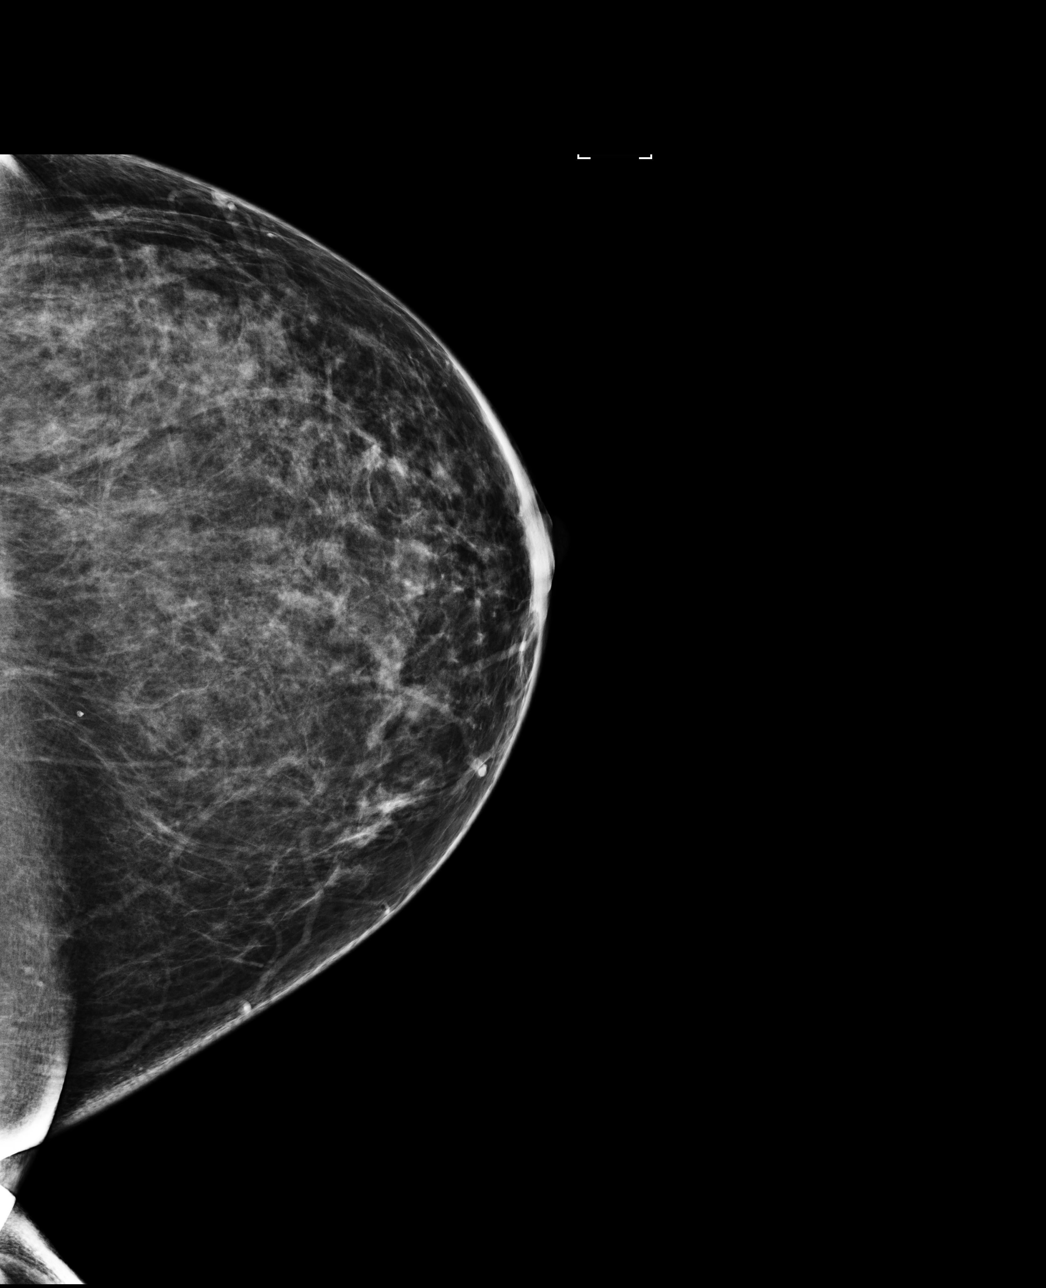

[4 of 4 positions shown; findings below may reference images not displayed]

ACR Breast Density Category b: There are scattered areas of
fibroglandular density.
FINDINGS: There are no findings suspicious for malignancy. Images were
processed with CAD.
IMPRESSION: No mammographic evidence of malignancy. A result letter of this
screening mammogram will be mailed directly to the patient.

RECOMMENDATION:
Screening mammogram in one year. (Code:AS-G-LCT)

BI-RADS CATEGORY  1: Negative.

## 2020-03-08 ENCOUNTER — Other Ambulatory Visit: Payer: Self-pay | Admitting: Student in an Organized Health Care Education/Training Program

## 2020-03-08 DIAGNOSIS — F32A Depression, unspecified: Secondary | ICD-10-CM

## 2020-03-08 DIAGNOSIS — F329 Major depressive disorder, single episode, unspecified: Secondary | ICD-10-CM

## 2020-04-04 ENCOUNTER — Other Ambulatory Visit: Payer: Self-pay | Admitting: Student in an Organized Health Care Education/Training Program

## 2020-04-04 DIAGNOSIS — F32A Depression, unspecified: Secondary | ICD-10-CM

## 2020-05-23 ENCOUNTER — Other Ambulatory Visit: Payer: Self-pay | Admitting: Student in an Organized Health Care Education/Training Program

## 2020-05-23 DIAGNOSIS — I10 Essential (primary) hypertension: Secondary | ICD-10-CM

## 2020-05-31 ENCOUNTER — Other Ambulatory Visit: Payer: Self-pay | Admitting: Student in an Organized Health Care Education/Training Program

## 2020-05-31 DIAGNOSIS — F32A Depression, unspecified: Secondary | ICD-10-CM

## 2020-05-31 DIAGNOSIS — F329 Major depressive disorder, single episode, unspecified: Secondary | ICD-10-CM

## 2020-06-09 ENCOUNTER — Other Ambulatory Visit: Payer: Self-pay | Admitting: Student in an Organized Health Care Education/Training Program

## 2020-06-09 DIAGNOSIS — F32A Depression, unspecified: Secondary | ICD-10-CM

## 2020-07-03 ENCOUNTER — Ambulatory Visit: Payer: 59 | Admitting: Student in an Organized Health Care Education/Training Program

## 2020-07-08 ENCOUNTER — Other Ambulatory Visit: Payer: Self-pay | Admitting: Family Medicine

## 2020-07-08 MED ORDER — LOSARTAN POTASSIUM 25 MG PO TABS: 25.0000 mg | ORAL_TABLET | Freq: Every day | ORAL | 0 refills | Status: DC

## 2020-07-08 NOTE — Addendum Note (Signed)
Addended by: Leeroy Bock on: 07/08/2020 04:16 PM   Modules accepted: Orders

## 2020-07-08 NOTE — Telephone Encounter (Addendum)
Spoke to patient and scheduled appointment for refills of Losartan. Today is her last pill.  Patient states that she is also out of Citalopram and has been out for over a month.  Patient works for the [employer] and is only available at certain times.  This is a new job for her and she has not been able to come in any sooner.  Suggested to patient that she call pharmacy to see if she could get an advance/emergent # of pills until her appointment.   Patient states that she has tried this in the past and her pharmacy will not accommodate.  Patient would like to know if there is anyway possible that she can get refills prior to her appointment on 07/17/2020 at 0930.  Informed patient that I would send message to PCP.  Glennie Hawk, CMA

## 2020-07-08 NOTE — Telephone Encounter (Signed)
Patient returns call to nurse line and is inquiring about receiving refill on citalopram until scheduled office visit on 07/19/20.   To PCP  Please advise.   Veronda Prude, RN

## 2020-07-09 NOTE — Telephone Encounter (Signed)
Thanks, I put a note in her chart. Since she has been off citalopram for a month already, I will not refill without an appointment. The risk of her having withdrawal symptoms after being off that long already is none. It would be more worrisome to start it back up without monitoring.

## 2020-07-19 ENCOUNTER — Other Ambulatory Visit: Payer: Self-pay

## 2020-07-19 ENCOUNTER — Ambulatory Visit: Payer: Managed Care, Other (non HMO) | Admitting: Student in an Organized Health Care Education/Training Program

## 2020-07-19 ENCOUNTER — Encounter: Payer: Self-pay | Admitting: Student in an Organized Health Care Education/Training Program

## 2020-07-19 DIAGNOSIS — I10 Essential (primary) hypertension: Secondary | ICD-10-CM | POA: Diagnosis not present

## 2020-07-19 DIAGNOSIS — F329 Major depressive disorder, single episode, unspecified: Secondary | ICD-10-CM

## 2020-07-19 DIAGNOSIS — F32A Depression, unspecified: Secondary | ICD-10-CM

## 2020-07-19 MED ORDER — LOSARTAN POTASSIUM 25 MG PO TABS: 25.0000 mg | ORAL_TABLET | Freq: Every day | ORAL | 1 refills | Status: DC

## 2020-07-19 MED ORDER — METFORMIN HCL ER 500 MG PO TB24: 500.0000 mg | ORAL_TABLET | Freq: Every day | ORAL | 0 refills | Status: DC

## 2020-07-19 MED ORDER — ESCITALOPRAM OXALATE 10 MG PO TABS: 10.0000 mg | ORAL_TABLET | Freq: Every day | ORAL | 3 refills | Status: DC

## 2020-07-19 NOTE — Progress Notes (Signed)
    SUBJECTIVE:   CHIEF COMPLAINT / HPI: med refill  Obesity- patient has been trying to lose weight diligently since Janurary. Limiting to 1200 calories per day and did intermittent fasting for several months prior to work schedule change. She also cut out gluten which minimized her carb intake. She describes a "clean" diet. Walks each day until knees hurt. About 3-5 miles per day. Does some yoga in the mornings. Has rotating work schedule and is sedentary during her shifts. Has a peddle under her desk. 262lbs today and at last visit.  Husband says that she snores.   LMP- March, and 6 months prior to that.  HTN- home monitor 121/71 is her average.  COVID exposure- patient brought mother in law to day surgery today for procedure prior to her appointment and was notified that the MIL tested positive for COVID just now. Patient is asymptomatic. COVID vaccinated.   OBJECTIVE:   BP 126/60   Ht 5\' 3"  (1.6 m)   Wt 262 lb (118.8 kg)   BMI 46.41 kg/m   General: NAD, pleasant, able to participate in exam Extremities: no edema or cyanosis. WWP. Skin: warm and dry, no rashes noted Neuro: alert and oriented, no focal deficits Psych: Normal affect and anxious mood  ASSESSMENT/PLAN:   Morbid obesity (HCC) Patient is not diabetic. Per report, she has implemented strict lifestyle changes >6 months for weight loss and has had no improvement.  She is declining counseling or referral to wellness center at this time.  In favor of starting metformin for weight positive benefits.   Essential hypertension Well controlled on current regimen - refill losartan - sleep study referral - continue to monitor at regular visits  Depression Due to errors in communication with pharmacy, etc. Patient has been off of citalopram for a couple weeks and had previously been taking 20mg  BID. States that it was not helping her but she is also worsened when not on anything. Declines counseling resources. Denies  SI. - prescribing escitalopram 10mg . F/u in 6 weeks and assess for need to increase dose. Counseled patient that this is a ONCE daily prescription     , DO [facility]

## 2020-07-19 NOTE — Patient Instructions (Signed)
It was a pleasure to see you today!  To summarize our discussion for this visit:  We will check your blood pressure today but it looks good at home. I will refill your BP medicine for extended time.  We are starting metformin today as well as referral to sleep study in reference to your weight loss. Remember that you may have stomach upset the first couple weeks of taking this medication.   We have switched your depression medication. Please give it 6 weeks to see full effects and then we can follow up with an appointment to discuss if we continue or change. I do recommend counseling in combination with the medication which you declined today  Some additional health maintenance measures we should update are: Health Maintenance Due  Topic Date Due  . Hepatitis C Screening  Never done  . COVID-19 Vaccine (1) Never done  . PAP SMEAR-Modifier  03/21/2013  . COLONOSCOPY  Never done  . INFLUENZA VACCINE  Never done  .    Please return to our clinic to see me in 6 weeks.  Call the clinic at [phone] if your symptoms worsen or you have any concerns.   Thank you for allowing me to take part in your care,  Dr. Jamelle Rushing

## 2020-07-19 NOTE — Assessment & Plan Note (Addendum)
Well controlled on current regimen - refill losartan - sleep study referral - continue to monitor at regular visits

## 2020-07-19 NOTE — Assessment & Plan Note (Signed)
Patient is not diabetic. Per report, she has implemented strict lifestyle changes >6 months for weight loss and has had no improvement.  She is declining counseling or referral to wellness center at this time.  In favor of starting metformin for weight positive benefits.

## 2020-07-19 NOTE — Assessment & Plan Note (Signed)
Due to errors in communication with pharmacy, etc. Patient has been off of citalopram for a couple weeks and had previously been taking 20mg  BID. States that it was not helping her but she is also worsened when not on anything. Declines counseling resources. Denies SI. - prescribing escitalopram 10mg . F/u in 6 weeks and assess for need to increase dose. Counseled patient that this is a ONCE daily prescription

## 2020-07-31 ENCOUNTER — Other Ambulatory Visit (HOSPITAL_COMMUNITY): Payer: Self-pay | Admitting: Student in an Organized Health Care Education/Training Program

## 2020-07-31 DIAGNOSIS — F32A Depression, unspecified: Secondary | ICD-10-CM

## 2020-07-31 DIAGNOSIS — I10 Essential (primary) hypertension: Secondary | ICD-10-CM

## 2020-07-31 NOTE — Progress Notes (Signed)
Sleep study order placed for [facility]

## 2020-10-16 ENCOUNTER — Other Ambulatory Visit: Payer: Self-pay | Admitting: Student in an Organized Health Care Education/Training Program

## 2020-11-27 ENCOUNTER — Encounter: Payer: Self-pay | Admitting: Student in an Organized Health Care Education/Training Program

## 2020-12-04 ENCOUNTER — Other Ambulatory Visit: Payer: Self-pay | Admitting: Student in an Organized Health Care Education/Training Program

## 2020-12-17 ENCOUNTER — Other Ambulatory Visit: Payer: Self-pay | Admitting: Student in an Organized Health Care Education/Training Program

## 2020-12-17 DIAGNOSIS — I10 Essential (primary) hypertension: Secondary | ICD-10-CM

## 2020-12-27 ENCOUNTER — Other Ambulatory Visit: Payer: Self-pay | Admitting: Student in an Organized Health Care Education/Training Program

## 2020-12-27 DIAGNOSIS — I10 Essential (primary) hypertension: Secondary | ICD-10-CM

## 2021-02-13 ENCOUNTER — Ambulatory Visit (INDEPENDENT_AMBULATORY_CARE_PROVIDER_SITE_OTHER): Payer: Managed Care, Other (non HMO) | Admitting: Student in an Organized Health Care Education/Training Program

## 2021-02-13 ENCOUNTER — Encounter: Payer: Self-pay | Admitting: Student in an Organized Health Care Education/Training Program

## 2021-02-13 ENCOUNTER — Other Ambulatory Visit: Payer: Self-pay

## 2021-02-13 DIAGNOSIS — F32A Depression, unspecified: Secondary | ICD-10-CM | POA: Diagnosis not present

## 2021-02-13 DIAGNOSIS — Z1159 Encounter for screening for other viral diseases: Secondary | ICD-10-CM

## 2021-02-13 DIAGNOSIS — Z79899 Other long term (current) drug therapy: Secondary | ICD-10-CM | POA: Diagnosis not present

## 2021-02-13 MED ORDER — SEMAGLUTIDE-WEIGHT MANAGEMENT 0.25 MG/0.5ML ~~LOC~~ SOAJ: 0.2500 mg | SUBCUTANEOUS | 1 refills | Status: DC

## 2021-02-13 MED ORDER — ESCITALOPRAM OXALATE 20 MG PO TABS: 20.0000 mg | ORAL_TABLET | Freq: Every day | ORAL | 1 refills | Status: DC

## 2021-02-13 MED ORDER — METFORMIN HCL ER 500 MG PO TB24: ORAL_TABLET | ORAL | 1 refills | Status: DC

## 2021-02-13 NOTE — Patient Instructions (Signed)
It was a pleasure to see you today!  To summarize our discussion for this visit:  For weight loss-  Please try to increase activity level per day to about 10,000 steps per day as well as add in some muscle building activities 3x per week  As you can tolerate, please increase your metformin by 500mg  per day up to a max of 1,000mg  twice per day  We are starting semaglutide today at a low dose. Let's recheck your progress in about 6 weeks.  Some additional health maintenance measures we should update are: Health Maintenance Due  Topic Date Due  . Hepatitis C Screening  Never done  . COVID-19 Vaccine (1) Never done  . PAP SMEAR-Modifier  03/21/2013  . COLONOSCOPY (Pts 45-34yrs Insurance coverage will need to be confirmed)  Never done  .   Call the clinic at [phone] if your symptoms worsen or you have any concerns.   Thank you for allowing me to take part in your care,  Dr. [phone]  Semaglutide injection solution What is this medicine? SEMAGLUTIDE (Sem a GLOO tide) is used to improve blood sugar control in adults with type 2 diabetes. This medicine may be used with other diabetes medicines. This drug may also reduce the risk of heart attack or stroke if you have type 2 diabetes and risk factors for heart disease. This medicine may be used for other purposes; ask your health care provider or pharmacist if you have questions. COMMON BRAND NAME(S): OZEMPIC What should I tell my health care provider before I take this medicine? They need to know if you have any of these conditions:  endocrine tumors (MEN 2) or if someone in your family had these tumors  eye disease, vision problems  history of pancreatitis  kidney disease  stomach problems  thyroid cancer or if someone in your family had thyroid cancer  an unusual or allergic reaction to semaglutide, other medicines, foods, dyes, or preservatives  pregnant or trying to get pregnant  breast-feeding How should I  use this medicine? This medicine is for injection under the skin of your upper leg (thigh), stomach area, or upper arm. It is given once every week (every 7 days). You will be taught how to prepare and give this medicine. Use exactly as directed. Take your medicine at regular intervals. Do not take it more often than directed. If you use this medicine with insulin, you should inject this medicine and the insulin separately. Do not mix them together. Do not give the injections right next to each other. Change (rotate) injection sites with each injection. It is important that you put your used needles and syringes in a special sharps container. Do not put them in a trash can. If you do not have a sharps container, call your pharmacist or healthcare provider to get one. A special MedGuide will be given to you by the pharmacist with each prescription and refill. Be sure to read this information carefully each time. This drug comes with INSTRUCTIONS FOR USE. Ask your pharmacist for directions on how to use this drug. Read the information carefully. Talk to your pharmacist or health care provider if you have questions. Talk to your pediatrician regarding the use of this medicine in children. Special care may be needed. Overdosage: If you think you have taken too much of this medicine contact a poison control center or emergency room at once. NOTE: This medicine is only for you. Do not share this medicine with others. What  if I miss a dose? If you miss a dose, take it as soon as you can within 5 days after the missed dose. Then take your next dose at your regular weekly time. If it has been longer than 5 days after the missed dose, do not take the missed dose. Take the next dose at your regular time. Do not take double or extra doses. If you have questions about a missed dose, contact your health care provider for advice. What may interact with this medicine?  other medicines for diabetes Many medications may  cause changes in blood sugar, these include:  alcohol containing beverages  antiviral medicines for HIV or AIDS  aspirin and aspirin-like drugs  certain medicines for blood pressure, heart disease, irregular heart beat  chromium  diuretics  female hormones, such as estrogens or progestins, birth control pills  fenofibrate  gemfibrozil  isoniazid  lanreotide  female hormones or anabolic steroids  MAOIs like Carbex, Eldepryl, Marplan, Nardil, and Parnate  medicines for weight loss  medicines for allergies, asthma, cold, or cough  medicines for depression, anxiety, or psychotic disturbances  niacin  nicotine  NSAIDs, medicines for pain and inflammation, like ibuprofen or naproxen  octreotide  pasireotide  pentamidine  phenytoin  probenecid  quinolone antibiotics such as ciprofloxacin, levofloxacin, ofloxacin  some herbal dietary supplements  steroid medicines such as prednisone or cortisone  sulfamethoxazole; trimethoprim  thyroid hormones Some medications can hide the warning symptoms of low blood sugar (hypoglycemia). You may need to monitor your blood sugar more closely if you are taking one of these medications. These include:  beta-blockers, often used for high blood pressure or heart problems (examples include atenolol, metoprolol, propranolol)  clonidine  guanethidine  reserpine This list may not describe all possible interactions. Give your health care provider a list of all the medicines, herbs, non-prescription drugs, or dietary supplements you use. Also tell them if you smoke, drink alcohol, or use illegal drugs. Some items may interact with your medicine. What should I watch for while using this medicine? Visit your doctor or health care professional for regular checks on your progress. Drink plenty of fluids while taking this medicine. Check with your doctor or health care professional if you get an attack of severe diarrhea, nausea, and  vomiting. The loss of too much body fluid can make it dangerous for you to take this medicine. A test called the HbA1C (A1C) will be monitored. This is a simple blood test. It measures your blood sugar control over the last 2 to 3 months. You will receive this test every 3 to 6 months. Learn how to check your blood sugar. Learn the symptoms of low and high blood sugar and how to manage them. Always carry a quick-source of sugar with you in case you have symptoms of low blood sugar. Examples include hard sugar candy or glucose tablets. Make sure others know that you can choke if you eat or drink when you develop serious symptoms of low blood sugar, such as seizures or unconsciousness. They must get medical help at once. Tell your doctor or health care professional if you have high blood sugar. You might need to change the dose of your medicine. If you are sick or exercising more than usual, you might need to change the dose of your medicine. Do not skip meals. Ask your doctor or health care professional if you should avoid alcohol. Many nonprescription cough and cold products contain sugar or alcohol. These can affect blood sugar.  Pens should never be shared. Even if the needle is changed, sharing may result in passing of viruses like hepatitis or HIV. Wear a medical ID bracelet or chain, and carry a card that describes your disease and details of your medicine and dosage times. Do not become pregnant while taking this medicine. Women should inform their doctor if they wish to become pregnant or think they might be pregnant. There is a potential for serious side effects to an unborn child. Talk to your health care professional or pharmacist for more information. What side effects may I notice from receiving this medicine? Side effects that you should report to your doctor or health care professional as soon as possible:  allergic reactions like skin rash, itching or hives, swelling of the face, lips, or  tongue  breathing problems  changes in vision  diarrhea that continues or is severe  lump or swelling on the neck  severe nausea  signs and symptoms of infection like fever or chills; cough; sore throat; pain or trouble passing urine  signs and symptoms of low blood sugar such as feeling anxious, confusion, dizziness, increased hunger, unusually weak or tired, sweating, shakiness, cold, irritable, headache, blurred vision, fast heartbeat, loss of consciousness  signs and symptoms of kidney injury like trouble passing urine or change in the amount of urine  trouble swallowing  unusual stomach upset or pain  vomiting Side effects that usually do not require medical attention (report to your doctor or health care professional if they continue or are bothersome):  constipation  diarrhea  nausea  pain, redness, or irritation at site where injected  stomach upset This list may not describe all possible side effects. Call your doctor for medical advice about side effects. You may report side effects to FDA at 1-800-FDA-1088. Where should I keep my medicine? Keep out of the reach of children. Store unopened pens in a refrigerator between 2 and 8 degrees C (36 and 46 degrees F). Do not freeze. Protect from light and heat. After you first use the pen, it can be stored for 56 days at room temperature between 15 and 30 degrees C (59 and 86 degrees F) or in a refrigerator. Throw away your used pen after 56 days or after the expiration date, whichever comes first. Do not store your pen with the needle attached. If the needle is left on, medicine may leak from the pen. NOTE: This sheet is a summary. It may not cover all possible information. If you have questions about this medicine, talk to your doctor, pharmacist, or health care provider.  2021 Elsevier/Gold Standard (2019-07-11 09:41:51)

## 2021-02-13 NOTE — Progress Notes (Signed)
   SUBJECTIVE:   CHIEF COMPLAINT / HPI: obesity  Obesity- eats "clean" with a high portion of vegetables and low carb/calorie foods. Only water and coffee. At least 5,000 steps per day and actively walking at work. No other outside activities. Limited TV time.  Has been adherent with Metformin and denies any negative side effects.  She has gained 5 pounds since last appointment.  Depression-patient states she has some benefit from Lexapro but continues to feel some depression effects such as slow movement, hard to motivate.  Requests that we increase lexapro  OBJECTIVE:   BP 126/70   Pulse 67   Ht 5\' 3"  (1.6 m)   Wt 267 lb 3.2 oz (121.2 kg)   SpO2 99%   BMI 47.33 kg/m   Physical Exam Vitals and nursing note reviewed.  Constitutional:      General: She is not in acute distress.    Appearance: She is obese. She is not ill-appearing or toxic-appearing.  HENT:     Head: Normocephalic.  Cardiovascular:     Rate and Rhythm: Normal rate and regular rhythm.  Pulmonary:     Effort: Pulmonary effort is normal.     Breath sounds: Normal breath sounds.  Neurological:     General: No focal deficit present.     Mental Status: She is alert and oriented to person, place, and time.  Psychiatric:        Mood and Affect: Mood normal.    ASSESSMENT/PLAN:   Morbid obesity (HCC) Discussed multiple options with patient in regards to weight loss.  Patient declined referral to nutritionist, healthy weight program, bariatric surgery. She desires to increase her Metformin, start semaglutide, continue lifestyle changes. Recommended she attempt to increase her daily steps to at least 10,000 and have some added weight or resistant training 3 times per week Follow-up 2 to 3 months, consider increasing semaglutide if tolerating well - lipid panel, BMP, hemoglobin A1c today -Update: Patient unable to give blood sample today so will return next week  Depression Increase Lexapro to 20 mg today, per  patient request Follow-up in 2 to 3 months with repeat PHQ-9 Denies SI     , DO [facility]

## 2021-02-13 NOTE — Assessment & Plan Note (Signed)
Increase Lexapro to 20 mg today, per patient request Follow-up in 2 to 3 months with repeat PHQ-9 Denies SI

## 2021-02-13 NOTE — Assessment & Plan Note (Addendum)
Discussed multiple options with patient in regards to weight loss.  Patient declined referral to nutritionist, healthy weight program, bariatric surgery. She desires to increase her Metformin, start semaglutide, continue lifestyle changes. Recommended she attempt to increase her daily steps to at least 10,000 and have some added weight or resistant training 3 times per week Follow-up 2 to 3 months, consider increasing semaglutide if tolerating well - lipid panel, BMP, hemoglobin A1c today -Update: Patient unable to give blood sample today so will return next week

## 2021-02-17 ENCOUNTER — Other Ambulatory Visit: Payer: Managed Care, Other (non HMO)

## 2021-03-24 ENCOUNTER — Other Ambulatory Visit: Payer: Self-pay | Admitting: Student in an Organized Health Care Education/Training Program

## 2021-03-25 ENCOUNTER — Encounter: Payer: Self-pay | Admitting: Student in an Organized Health Care Education/Training Program

## 2021-04-15 ENCOUNTER — Other Ambulatory Visit: Payer: Self-pay | Admitting: Student in an Organized Health Care Education/Training Program

## 2021-04-24 ENCOUNTER — Other Ambulatory Visit: Payer: Managed Care, Other (non HMO)

## 2021-04-24 ENCOUNTER — Other Ambulatory Visit: Payer: Self-pay

## 2021-04-24 DIAGNOSIS — I10 Essential (primary) hypertension: Secondary | ICD-10-CM

## 2021-04-25 LAB — BASIC METABOLIC PANEL
BUN/Creatinine Ratio: 23 (ref 9–23)
BUN: 14 mg/dL (ref 6–24)
CO2: 17 mmol/L — ABNORMAL LOW (ref 20–29)
Calcium: 9.7 mg/dL (ref 8.7–10.2)
Chloride: 103 mmol/L (ref 96–106)
Creatinine, Ser: 0.61 mg/dL (ref 0.57–1.00)
Glucose: 95 mg/dL (ref 65–99)
Potassium: 4 mmol/L (ref 3.5–5.2)
Sodium: 142 mmol/L (ref 134–144)
eGFR: 107 mL/min/{1.73_m2} (ref >59)

## 2021-04-29 ENCOUNTER — Other Ambulatory Visit: Payer: Self-pay | Admitting: Student in an Organized Health Care Education/Training Program

## 2021-05-17 ENCOUNTER — Other Ambulatory Visit: Payer: Self-pay | Admitting: Student in an Organized Health Care Education/Training Program

## 2021-07-24 ENCOUNTER — Other Ambulatory Visit: Payer: Self-pay | Admitting: Student in an Organized Health Care Education/Training Program

## 2021-07-25 NOTE — Telephone Encounter (Addendum)
LVM for patient to call [facility] to schedule an appointment.  .Lynelle Weiler R Elodie Panameno, CMA  

## 2021-08-31 ENCOUNTER — Other Ambulatory Visit: Payer: Self-pay | Admitting: Student

## 2021-09-15 ENCOUNTER — Other Ambulatory Visit: Payer: Self-pay | Admitting: Student in an Organized Health Care Education/Training Program

## 2021-09-16 ENCOUNTER — Telehealth: Payer: Self-pay | Admitting: Student

## 2021-09-16 ENCOUNTER — Encounter: Payer: Self-pay | Admitting: Student

## 2021-09-16 NOTE — Telephone Encounter (Signed)
Called patient about refill request for lexapro. Based on last refill of 90 tabs on 08/11/21, pt should not need refill yet. Asked pt to call back with clarification if she does need a refill.

## 2021-09-17 ENCOUNTER — Encounter: Payer: Self-pay | Admitting: Student

## 2021-09-29 ENCOUNTER — Other Ambulatory Visit: Payer: Self-pay | Admitting: Student

## 2021-10-07 NOTE — Telephone Encounter (Signed)
LMOVM for pt to call back and schedule an appt. July Linam, CMA  

## 2021-10-23 ENCOUNTER — Other Ambulatory Visit: Payer: Self-pay

## 2021-10-23 ENCOUNTER — Ambulatory Visit: Payer: Managed Care, Other (non HMO) | Admitting: Student

## 2021-10-23 ENCOUNTER — Other Ambulatory Visit (HOSPITAL_COMMUNITY)
Admission: RE | Admit: 2021-10-23 | Discharge: 2021-10-23 | Disposition: A | Discharge status: Home / Self Care | Payer: Managed Care, Other (non HMO) | Source: Ambulatory Visit | Attending: Family Medicine | Admitting: Family Medicine

## 2021-10-23 ENCOUNTER — Encounter: Payer: Self-pay | Admitting: Student

## 2021-10-23 VITALS — BP 153/89 | HR 76 | Wt 223.2 lb

## 2021-10-23 DIAGNOSIS — I10 Essential (primary) hypertension: Secondary | ICD-10-CM | POA: Diagnosis not present

## 2021-10-23 DIAGNOSIS — Z124 Encounter for screening for malignant neoplasm of cervix: Secondary | ICD-10-CM | POA: Insufficient documentation

## 2021-10-23 DIAGNOSIS — Z1211 Encounter for screening for malignant neoplasm of colon: Secondary | ICD-10-CM

## 2021-10-23 MED ORDER — ZOSTER VAC RECOMB ADJUVANTED 50 MCG/0.5ML IM SUSR: 0.5000 mL | Freq: Once | INTRAMUSCULAR | 0 refills | Status: AC

## 2021-10-23 MED ORDER — LOSARTAN POTASSIUM 50 MG PO TABS: 50.0000 mg | ORAL_TABLET | Freq: Every day | ORAL | 0 refills | Status: DC

## 2021-10-23 NOTE — Addendum Note (Signed)
Addended by: Howard Pouch on: 10/23/2021 04:24 PM   Modules accepted: Orders

## 2021-10-23 NOTE — Progress Notes (Signed)
° ° °  SUBJECTIVE:   CHIEF COMPLAINT / HPI: Follow up on weight loss and medications  PERTINENT  PMH / PSH: Hypertension, obesity, depression  Weight loss Stopped taking metformin in October because she wanted to try only one weight loss med. Also, she checked her BS using her moms monitor and it was in the 60's after eating.  Started on similitude 8 months ago on 02/13/21. Has since lost 44 lbs. She is currently doing keto diet which she enjoys.   Depression and anxiety Increased Lexapro to 20 mg in April. Still having issues with sleep and focus. Mood has improved. Works 3rd shift 4 on 4 off. Sleeps better during the day, but night time sleep when off work is not great, hard to stay alseep. When she wake up she will toss and turn, sometimes just get up. Has not tried any OTC meds. Takes Meno supplement for menopause, helps with night sweats but not sleep.   HTN BP runs "in the green" on her home monitor Stopped taking HCTZ about 4 months when didn't refill med. Feels constantly dehydrated, doesn't want a diuretic.  Is interested in a different type of BP med.      OBJECTIVE:   Vitals:   10/23/21 1336  BP: (!) 153/89  Pulse: 76  SpO2: 99%     General: NAD, pleasant, able to participate in exam Cardiac: Well perfused Respiratory: Breathing comfortably on room air Skin: warm and dry, no rashes noted Genitourinary: Chaperoned by CMA Desiree. normal female external genitalia, no lesions, ulcers, warts on skin.  Moist and pink vaginal tissue, normal-appearing cervix with no lesions, cysts, or masses Neuro: alert, no obvious focal deficits Psych: Normal affect and mood  ASSESSMENT/PLAN:  Obesity Continue current regimen with Ozempic only combined with exercises and keto diet  Hypertension -Increase losartan to 50 mg daily -BNP in 1 week -return for wellness visit in a few months and can recheck BP  Health maintenance Last PAP was in 2011, pt had PAP with co testing today   Does not want Colonoscopy, would prefer cologaurd  Does not want Hep C testing shingrix vaccine ordered  Anxiety and depression  -Continue Lexapro 20 mg daily  Pt will return for wellness exam in a few months and can consider further discussing sleep, was advised to try OTC melatonin for now   Dr. Erick Alley, DO [facility]

## 2021-10-23 NOTE — Patient Instructions (Addendum)
It was great to see you! Thank you for allowing me to participate in your care!  I recommend that you always bring your medications to each appointment as this makes it easy to ensure you are on the correct medications and helps [place] not miss when refills are needed.  Our plans for today:  - I have ordered the cologaurd test for your colon cancer screening. It will be mailed to your house -Please return in 3-4 months for a wellness visit and to further discuss sleep issues -I have increased your dose of Losartan to 50 mg daily -Please return in one week for a lab only visit   We are checking some labs today, I will call you if they are abnormal will send you a MyChart message or a letter if they are normal.  If you do not hear about your labs in the next 2 weeks please let us know.  Take care and seek immediate care sooner if you develop any concerns.   Dr. Erick Alley, DO [facility]

## 2021-10-23 NOTE — Assessment & Plan Note (Signed)
-  Increase losartan to 50 mg daily -BNP in 1 week

## 2021-10-23 NOTE — Assessment & Plan Note (Signed)
Continue current regimen with Ozempic only combined with exercises and keto diet

## 2021-10-24 LAB — CYTOLOGY - PAP
Comment: NEGATIVE
Diagnosis: NEGATIVE
High risk HPV: NEGATIVE

## 2021-11-05 ENCOUNTER — Other Ambulatory Visit: Payer: Self-pay | Admitting: Student

## 2021-11-26 ENCOUNTER — Encounter: Payer: Self-pay | Admitting: Student

## 2021-11-26 LAB — COLOGUARD: COLOGUARD: NEGATIVE

## 2021-11-26 NOTE — Progress Notes (Signed)
Created in error

## 2022-01-07 ENCOUNTER — Other Ambulatory Visit: Payer: Self-pay | Admitting: Student

## 2022-01-07 ENCOUNTER — Encounter: Payer: Self-pay | Admitting: Student

## 2022-02-04 ENCOUNTER — Other Ambulatory Visit: Payer: Self-pay | Admitting: Student

## 2022-02-05 ENCOUNTER — Other Ambulatory Visit: Payer: Self-pay | Admitting: Student

## 2022-02-06 ENCOUNTER — Other Ambulatory Visit: Payer: Self-pay | Admitting: Student

## 2022-02-06 ENCOUNTER — Encounter: Payer: Self-pay | Admitting: Student

## 2022-02-06 NOTE — Progress Notes (Signed)
Encounter opened in error

## 2022-02-10 ENCOUNTER — Other Ambulatory Visit: Payer: Self-pay | Admitting: Student

## 2022-02-10 MED ORDER — OZEMPIC (0.25 OR 0.5 MG/DOSE) 2 MG/1.5ML ~~LOC~~ SOPN: 0.2500 mg | PEN_INJECTOR | SUBCUTANEOUS | 1 refills | Status: DC

## 2022-02-11 ENCOUNTER — Other Ambulatory Visit: Payer: Self-pay | Admitting: Student

## 2022-02-11 ENCOUNTER — Other Ambulatory Visit: Payer: Managed Care, Other (non HMO)

## 2022-02-11 ENCOUNTER — Other Ambulatory Visit: Payer: Self-pay

## 2022-02-11 DIAGNOSIS — I10 Essential (primary) hypertension: Secondary | ICD-10-CM

## 2022-02-11 MED ORDER — OZEMPIC (0.25 OR 0.5 MG/DOSE) 2 MG/1.5ML ~~LOC~~ SOPN: 0.2500 mg | PEN_INJECTOR | SUBCUTANEOUS | 1 refills | Status: DC

## 2022-02-11 NOTE — Progress Notes (Signed)
Pt came for BMP check d/t BP medication change back in December 2022 (was advised to come 1 week after visit). Per Molly Maduro in lab, pt requested this to be changed to a CMP d/t "insurance perks".  ?

## 2022-02-11 NOTE — Telephone Encounter (Signed)
[facility] does not carry stock of Ozempic due to price.  ? ?I have resent Ozempic to [facility] per patient request.  ?

## 2022-02-12 ENCOUNTER — Encounter: Payer: Self-pay | Admitting: Student

## 2022-02-12 LAB — COMPREHENSIVE METABOLIC PANEL
ALT: 17 IU/L (ref 0–32)
AST: 16 IU/L (ref 0–40)
Albumin/Globulin Ratio: 1.6 (ref 1.2–2.2)
Albumin: 4.3 g/dL (ref 3.8–4.9)
Alkaline Phosphatase: 104 IU/L (ref 44–121)
BUN/Creatinine Ratio: 22 (ref 9–23)
BUN: 17 mg/dL (ref 6–24)
Bilirubin Total: 0.2 mg/dL (ref 0.0–1.2)
CO2: 22 mmol/L (ref 20–29)
Calcium: 9.8 mg/dL (ref 8.7–10.2)
Chloride: 101 mmol/L (ref 96–106)
Creatinine, Ser: 0.77 mg/dL (ref 0.57–1.00)
Globulin, Total: 2.7 g/dL (ref 1.5–4.5)
Glucose: 111 mg/dL — ABNORMAL HIGH (ref 70–99)
Potassium: 4.2 mmol/L (ref 3.5–5.2)
Sodium: 138 mmol/L (ref 134–144)
Total Protein: 7 g/dL (ref 6.0–8.5)
eGFR: 92 mL/min/{1.73_m2} (ref >59)

## 2022-02-16 ENCOUNTER — Encounter: Payer: Self-pay | Admitting: Student

## 2022-02-17 ENCOUNTER — Other Ambulatory Visit: Payer: Self-pay | Admitting: Student

## 2022-02-17 DIAGNOSIS — I10 Essential (primary) hypertension: Secondary | ICD-10-CM

## 2022-02-17 NOTE — Progress Notes (Signed)
Attempted to call patient but unable to reach her. Left a generic voicemail for her.  ?

## 2022-02-19 ENCOUNTER — Other Ambulatory Visit: Payer: Self-pay | Admitting: Student

## 2022-02-19 DIAGNOSIS — I10 Essential (primary) hypertension: Secondary | ICD-10-CM

## 2022-02-19 MED ORDER — HYDROCHLOROTHIAZIDE 12.5 MG PO TABS: 12.5000 mg | ORAL_TABLET | Freq: Every day | ORAL | 0 refills | Status: DC

## 2022-02-19 NOTE — Progress Notes (Signed)
Covering for Dr. Yetta Barre and got a message from patient that she's been having bilateral edema for the past 2 months. She report she was on HCTZ 25mg  and Losartan for BP in the past but discontinued HCTZ on her own due to concerns for dehydration because she was having dry mouth at the time. BP at home is in the 120s/80s. Informed patient I will send in Rx for HCTZ 12.5mg  daily and advised patient to schedule a follow up in 2 weeks to monitor BP, edema and electrolytes. Strict return precautions were discussed and patient verbalized understanding and agreeable to plan.  ?

## 2022-03-09 ENCOUNTER — Other Ambulatory Visit: Payer: Self-pay | Admitting: Student

## 2022-03-09 DIAGNOSIS — I10 Essential (primary) hypertension: Secondary | ICD-10-CM

## 2022-03-10 MED ORDER — LOSARTAN POTASSIUM 50 MG PO TABS: 50.0000 mg | ORAL_TABLET | Freq: Every day | ORAL | 0 refills | Status: DC

## 2022-04-14 ENCOUNTER — Encounter: Payer: Self-pay | Admitting: *Deleted

## 2022-04-21 NOTE — Progress Notes (Unsigned)
    SUBJECTIVE:   CHIEF COMPLAINT / HPI:   Obesity Started keto diet in June 2022. She requests lipid panel today, Last done in 2020 and WNL. She currently takes Ozempic for weight loss, has been taking 0.5 mg weekly. She tolerates this well. Has lost 10 since December and feels like she is at a stand still in terms of weight loss. She does piliates two days/week, yoga daily and walks 3,500 to 70000 steps/day. She in interested in lifting weights more.   HTN Ran out of meds two days ago and hasn't been to pick them up. BP elevated today to 145/80.  Health maintenance Had mammogram with [facility], pt states it was normal and will get records to [place].  Hep C screen today Pt would like prescription for zoster vaccine   PERTINENT  PMH / PSH: HTN, obesity  OBJECTIVE:   Vitals:   04/22/22 1058  BP: (!) 145/80  Pulse: (!) 53  Temp: 98 F (36.7 C)  SpO2: 98%     General: NAD, pleasant, able to participate in exam Cardiac: RRR, no murmurs. Respiratory: CTAB, normal effort, No wheezes, rales or rhonchi Abdomen: Bowel sounds present, nontender, nondistended, soft Extremities: non pitting edema of BLEs Skin: warm and dry, no rashes noted Neuro: alert, no obvious focal deficits Psych: Normal affect and mood  ASSESSMENT/PLAN:   Obesity Increase Ozempic to 1 mg weekly for 4 weeks, if tolerating well, can increase further.   HTN Continue current medications. BP likely high today d/t being out of meds for two days.   Health Maintenance Prescription sent to pharmacy for zoster vaccine Hep C screening today Lipid panel today  Dr. Erick Alley, DO  [facility]

## 2022-04-22 ENCOUNTER — Ambulatory Visit (INDEPENDENT_AMBULATORY_CARE_PROVIDER_SITE_OTHER): Payer: Managed Care, Other (non HMO) | Admitting: Student

## 2022-04-22 ENCOUNTER — Encounter: Payer: Self-pay | Admitting: Student

## 2022-04-22 ENCOUNTER — Encounter: Payer: Managed Care, Other (non HMO) | Admitting: Student

## 2022-04-22 DIAGNOSIS — Z0000 Encounter for general adult medical examination without abnormal findings: Secondary | ICD-10-CM

## 2022-04-22 MED ORDER — OZEMPIC (0.25 OR 0.5 MG/DOSE) 2 MG/1.5ML ~~LOC~~ SOPN: 1.0000 mg | PEN_INJECTOR | SUBCUTANEOUS | 1 refills | Status: DC

## 2022-04-22 MED ORDER — SHINGRIX 50 MCG/0.5ML IM SUSR: 0.5000 mL | Freq: Once | INTRAMUSCULAR | 0 refills | Status: AC

## 2022-04-22 NOTE — Assessment & Plan Note (Signed)
Increase Ozempic to 1 mg weekly for 4 weeks, if tolerating well, can increase further.

## 2022-04-22 NOTE — Patient Instructions (Signed)
It was great to see you! Thank you for allowing me to participate in your care!  Our plans for today:  - Increase Ozempic to 1 mg weekly for 4 weeks, if you tolerate this well, we can further increase the dose.  -I sent in a prescription for the [place] vaccines to your pharmacy   We are checking some labs today, I will call you if they are abnormal will send you a MyChart message or a letter if they are normal.  If you do not hear about your labs in the next 2 weeks please let us know.  Take care and seek immediate care sooner if you develop any concerns.   Dr. Precious Gilding, DO [facility]

## 2022-04-23 LAB — LIPID PANEL
Chol/HDL Ratio: 3.1 ratio (ref 0.0–4.4)
Cholesterol, Total: 178 mg/dL (ref 100–199)
HDL: 58 mg/dL (ref >39)
LDL Chol Calc (NIH): 108 mg/dL — ABNORMAL HIGH (ref 0–99)
Triglycerides: 61 mg/dL (ref 0–149)
VLDL Cholesterol Cal: 12 mg/dL (ref 5–40)

## 2022-04-23 LAB — HCV INTERPRETATION

## 2022-04-23 LAB — HCV AB W REFLEX TO QUANT PCR: HCV Ab: NONREACTIVE

## 2022-04-27 ENCOUNTER — Other Ambulatory Visit: Payer: Self-pay | Admitting: Student

## 2022-04-27 DIAGNOSIS — R739 Hyperglycemia, unspecified: Secondary | ICD-10-CM

## 2022-04-27 MED ORDER — SEMAGLUTIDE (1 MG/DOSE) 4 MG/3ML ~~LOC~~ SOPN: 1.0000 mg | PEN_INJECTOR | SUBCUTANEOUS | 1 refills | Status: DC

## 2022-04-28 ENCOUNTER — Telehealth: Payer: Self-pay

## 2022-04-28 NOTE — Telephone Encounter (Signed)
A Prior Authorization was initiated for this patients OZEMPIC through CoverMyMeds.   Key: Donetta Potts

## 2022-05-05 ENCOUNTER — Other Ambulatory Visit (HOSPITAL_COMMUNITY): Payer: Self-pay

## 2022-05-11 ENCOUNTER — Telehealth: Payer: Self-pay

## 2022-05-13 ENCOUNTER — Other Ambulatory Visit: Payer: Self-pay | Admitting: Student

## 2022-05-13 DIAGNOSIS — R739 Hyperglycemia, unspecified: Secondary | ICD-10-CM

## 2022-05-13 MED ORDER — SEMAGLUTIDE(0.25 OR 0.5MG/DOS) 2 MG/3ML ~~LOC~~ SOPN: 1.0000 mg | PEN_INJECTOR | SUBCUTANEOUS | 2 refills | Status: DC

## 2022-05-14 NOTE — Telephone Encounter (Signed)
Medication refilled by Dr. Darnelle Spangle.  Glennie Hawk, CMA

## 2022-05-18 ENCOUNTER — Telehealth: Payer: Self-pay

## 2022-05-18 ENCOUNTER — Other Ambulatory Visit: Payer: Self-pay | Admitting: Student

## 2022-05-18 DIAGNOSIS — R739 Hyperglycemia, unspecified: Secondary | ICD-10-CM

## 2022-05-18 MED ORDER — SEMAGLUTIDE (1 MG/DOSE) 4 MG/3ML ~~LOC~~ SOPN: 1.0000 mg | PEN_INJECTOR | SUBCUTANEOUS | 2 refills | Status: DC

## 2022-05-18 NOTE — Telephone Encounter (Signed)
Pharmacy calls nurse line requesting Ozempic change.  Please resend in 2mg /29ml for 1mg  dosing.   Will forward to PCP.

## 2022-05-21 ENCOUNTER — Other Ambulatory Visit: Payer: Self-pay

## 2022-05-21 NOTE — Addendum Note (Signed)
Addended by: Veronda Prude on: 05/21/2022 04:10 PM   Modules accepted: Orders

## 2022-05-21 NOTE — Telephone Encounter (Signed)
[facility] from [facility] requesting 90 day supply on the following medications.   - Escitalopram - Ozempic - Losartan  Called patient, as [facility] is not on her pharmacy list. She did not answer, LVM to return call to office to verify that request was made by her.   Veronda Prude, RN

## 2022-05-25 MED ORDER — LOSARTAN POTASSIUM 50 MG PO TABS
50.0000 mg | ORAL_TABLET | Freq: Every day | ORAL | 0 refills | Status: DC
Start: 2022-05-25 — End: 2022-06-29

## 2022-05-25 MED ORDER — ESCITALOPRAM OXALATE 20 MG PO TABS
20.0000 mg | ORAL_TABLET | Freq: Every day | ORAL | 1 refills | Status: DC
Start: 2022-05-25 — End: 2022-06-29

## 2022-06-29 ENCOUNTER — Other Ambulatory Visit: Payer: Self-pay | Admitting: Student

## 2022-06-29 ENCOUNTER — Encounter: Payer: Self-pay | Admitting: Student

## 2022-06-29 ENCOUNTER — Other Ambulatory Visit: Payer: Self-pay

## 2022-06-29 MED ORDER — SEMAGLUTIDE (2 MG/DOSE) 8 MG/3ML ~~LOC~~ SOPN
2.0000 mg | PEN_INJECTOR | SUBCUTANEOUS | 3 refills | Status: DC
Start: 2022-06-29 — End: 2022-06-29

## 2022-06-29 MED ORDER — SEMAGLUTIDE (2 MG/DOSE) 8 MG/3ML ~~LOC~~ SOPN: 2.0000 mg | PEN_INJECTOR | SUBCUTANEOUS | 1 refills | Status: DC

## 2022-06-29 MED ORDER — ESCITALOPRAM OXALATE 20 MG PO TABS: 20.0000 mg | ORAL_TABLET | Freq: Every day | ORAL | 1 refills | Status: DC

## 2022-06-29 MED ORDER — LOSARTAN POTASSIUM 50 MG PO TABS: 50.0000 mg | ORAL_TABLET | Freq: Every day | ORAL | 0 refills | Status: DC

## 2022-06-29 NOTE — Progress Notes (Signed)
Pt is doing well on ozempic 1 mg, would like to go ahead and increase to 2 mg at this time. New Prescriptions sent.

## 2022-06-29 NOTE — Telephone Encounter (Signed)
Patient calls nurse line requesting refills on escitalopram, losartan and Ozempic to be sent to [facility].   She states that she just picked up prescriptions from local pharmacy, however, [facility] needs approx 4 weeks to process prescriptions.   Will forward to PCP.   Veronda Prude, RN

## 2022-08-09 ENCOUNTER — Encounter: Payer: Self-pay | Admitting: Student

## 2022-08-11 MED ORDER — SEMAGLUTIDE (2 MG/DOSE) 8 MG/3ML ~~LOC~~ SOPN: 2.0000 mg | PEN_INJECTOR | SUBCUTANEOUS | 1 refills | Status: DC

## 2022-08-22 ENCOUNTER — Other Ambulatory Visit: Payer: Self-pay | Admitting: Student

## 2022-08-22 MED ORDER — TRULICITY 1.5 MG/0.5ML ~~LOC~~ SOAJ: 1.5000 mg | SUBCUTANEOUS | 3 refills | Status: DC

## 2022-08-22 NOTE — Progress Notes (Signed)
Transitioning from [place] to Trulicity due to insurance. Will trial Trulicity 9.76 dose from Ozempic 2 mg dose and increase as tolerated to maximum dose for maximum benefit. Ordered to pharmacy

## 2022-08-31 ENCOUNTER — Other Ambulatory Visit: Payer: Self-pay

## 2022-08-31 NOTE — Telephone Encounter (Signed)
Patient calls nurse line requesting to transfer Trulicity from [place] to [place].   Called and canceled rx at [place].   Current quantity is for one week supply. If appropriate, can quantity be updated to 28 day supply and sent to [facility].   Thanks.   Talbot Grumbling, RN

## 2022-09-01 MED ORDER — TRULICITY 1.5 MG/0.5ML ~~LOC~~ SOAJ
1.5000 mg | SUBCUTANEOUS | 3 refills | Status: DC
Start: 2022-09-01 — End: 2022-12-26

## 2022-09-09 ENCOUNTER — Other Ambulatory Visit: Payer: Self-pay | Admitting: Student

## 2022-12-09 ENCOUNTER — Encounter: Payer: Self-pay | Admitting: Student

## 2022-12-14 ENCOUNTER — Other Ambulatory Visit (HOSPITAL_COMMUNITY): Payer: Self-pay

## 2022-12-24 ENCOUNTER — Telehealth: Payer: Self-pay

## 2022-12-24 NOTE — Telephone Encounter (Signed)
A Prior Authorization was initiated for this patients TRULICITY through CoverMyMeds.   Key: [id number]

## 2022-12-26 ENCOUNTER — Other Ambulatory Visit: Payer: Self-pay | Admitting: Student

## 2022-12-26 ENCOUNTER — Encounter: Payer: Self-pay | Admitting: Student

## 2022-12-26 DIAGNOSIS — R739 Hyperglycemia, unspecified: Secondary | ICD-10-CM

## 2022-12-26 MED ORDER — TRULICITY 1.5 MG/0.5ML ~~LOC~~ SOAJ: 1.5000 mg | SUBCUTANEOUS | 3 refills | Status: DC

## 2022-12-26 NOTE — Progress Notes (Signed)
Pt unable to get ozempic. Will send in prescription for trulicity although I doubt it will be covered by insurance. If not, pt would like to start back on metformin to help with weight loss.

## 2022-12-30 ENCOUNTER — Other Ambulatory Visit: Payer: Self-pay | Admitting: Student

## 2022-12-30 DIAGNOSIS — R739 Hyperglycemia, unspecified: Secondary | ICD-10-CM

## 2022-12-30 MED ORDER — METFORMIN HCL ER 500 MG PO TB24: ORAL_TABLET | ORAL | 1 refills | Status: DC

## 2022-12-30 NOTE — Progress Notes (Signed)
Pt requests to go back on metformin for blood sugar control and weight loss as insurance will no longer cover trulicity or ozempic

## 2022-12-31 NOTE — Telephone Encounter (Signed)
Prior Auth for patients medication TRULICITY denied by [employer] via CoverMyMeds.   Reason: Coverage is provided for the diagnosis of diabetes mellitus type 2. Coverage cannot be authorized at this time.  CoverMyMeds Key: [id number]

## 2023-01-04 ENCOUNTER — Other Ambulatory Visit: Payer: Self-pay | Admitting: Student

## 2023-01-13 NOTE — Telephone Encounter (Signed)
Called pharmacy and confirmed they did not receive prescription.   They were unable to take a verbal from me.   Will forward to PCP to resend.

## 2023-01-15 ENCOUNTER — Other Ambulatory Visit: Payer: Self-pay | Admitting: Student

## 2023-01-15 DIAGNOSIS — R739 Hyperglycemia, unspecified: Secondary | ICD-10-CM

## 2023-01-15 MED ORDER — METFORMIN HCL ER 500 MG PO TB24: ORAL_TABLET | ORAL | 1 refills | Status: DC

## 2023-05-03 ENCOUNTER — Other Ambulatory Visit: Payer: Self-pay | Admitting: Student

## 2023-05-03 DIAGNOSIS — R739 Hyperglycemia, unspecified: Secondary | ICD-10-CM

## 2023-06-12 ENCOUNTER — Encounter: Payer: Self-pay | Admitting: Student

## 2023-06-22 NOTE — Progress Notes (Signed)
    SUBJECTIVE:   Chief compliant/HPI: annual examination  Kristen Carney is a 52 y.o. who presents today for an annual exam.   Night sweats and fatigue  Worsening night sweats for past 3-4 months, does not have to change clothes or bedding through the night but clothes are damp in the morning. No significant weight loss. No fevers. No coughing.  No palpitations LMP 2021 Difficult time concentrating/feeling foggy and fatigued for past few months Goes to the gym every other day - weights and cardio and able to do without difficulty   Insomnia Works 3rd shift for past 3 years but sleeping difficulty worsened this past year.  Hard time falling asleep, wakes up around 2 pm and can't fall back asleep Does sometimes flip to sleeping at night every other month when working 4 on 4 off.    OBJECTIVE:   BP 118/88   Pulse 63   Ht 5\' 3"  (1.6 m)   Wt 224 lb 12.8 oz (102 kg)   LMP  (LMP Unknown)   SpO2 99%   BMI 39.82 kg/m    General: Well-appearing 52 year old female, NAD HEENT and lymphnodes: White sclera, clear conjunctiva, no cervical, submandibular, supraclavicular, axillary lymphadenopathy Cardiac: RRR, S1/S2 Lungs: CTAB normal effort Neuro: Alert, no focal deficits Psych: Mood and affect appropriate for situation  ASSESSMENT/PLAN:   Insomnia Shared decision making used, will trial trazodone.  Discussed good sleep hygiene.  Can consider anxiety/depression contributing to insomnia.  Currently on Lexapro.  Discussed utilizing psychology [email or web address] to find therapist which pt plans on doing.   Night sweats Night sweats, fatigue, feeling foggy could be related to postmenopausal symptoms although she is now ~3 years out.  Fatigue and feeling foggy more likely related to insomnia.  Will rule out anemia and thyroid disorders contributing factors.  Will check CBC with diff to look for any signs of hematologic malignancies. -CBC -TSH -CTM, will consider further workup with CXR and TB  testing although infectious etiology is unlikely in setting of no fevers or weight loss.    Annual Examination  See AVS for age appropriate recommendations  PHQ score , reviewed and discussed. - taking lexapro, plans on establishing care with therapist  BP reviewed and at goal .  Asked about intimate partner violence and resources given as appropriate  Advance directives discussion offered   Considered the following items based upon USPSTF recommendations: HIV testing:  Negative in 219 Hepatitis C:  Negative in 2023 Hepatitis B: discussed Syphilis if at high risk: discussed GC/CT not at high risk and not ordered.   Cervical cancer screening:  UTD Breast cancer screening:  scheduled for later this month  Colorectal cancer screening:  cologaurd neg 11/2021   Follow up in a few weeks    Erick Alley, DO [facility]

## 2023-06-23 ENCOUNTER — Ambulatory Visit (INDEPENDENT_AMBULATORY_CARE_PROVIDER_SITE_OTHER): Payer: Self-pay | Admitting: Student

## 2023-06-23 ENCOUNTER — Encounter: Payer: Self-pay | Admitting: Student

## 2023-06-23 VITALS — BP 118/88 | HR 63 | Ht 63.0 in | Wt 224.8 lb

## 2023-06-23 DIAGNOSIS — Z0000 Encounter for general adult medical examination without abnormal findings: Secondary | ICD-10-CM

## 2023-06-23 DIAGNOSIS — G4700 Insomnia, unspecified: Secondary | ICD-10-CM

## 2023-06-23 DIAGNOSIS — I10 Essential (primary) hypertension: Secondary | ICD-10-CM

## 2023-06-23 DIAGNOSIS — R61 Generalized hyperhidrosis: Secondary | ICD-10-CM

## 2023-06-23 DIAGNOSIS — R5383 Other fatigue: Secondary | ICD-10-CM

## 2023-06-23 MED ORDER — HYDROCHLOROTHIAZIDE 12.5 MG PO TABS: 12.5000 mg | ORAL_TABLET | Freq: Every day | ORAL | 3 refills | Status: AC

## 2023-06-23 MED ORDER — TRAZODONE HCL 50 MG PO TABS
25.0000 mg | ORAL_TABLET | Freq: Every evening | ORAL | 3 refills | Status: AC | PRN
Start: 2023-06-23 — End: ?

## 2023-06-23 NOTE — Patient Instructions (Addendum)
It was great to see you! Thank you for allowing me to participate in your care!  I recommend that you always bring your medications to each appointment as this makes it easy to ensure you are on the correct medications and helps [place] not miss when refills are needed.  Our plans for today:  - Continue to try and have good sleep hygiene as we discsussed - Trazodone sent to pharmacy for sleep. Take 1/2 to 1 full tablet as needed - return in a few weeks for follow up   We are checking some labs today, I will call you if they are abnormal will send you a MyChart message or a letter if they are normal.  If you do not hear about your labs in the next 2 weeks please let us know.  Take care and seek immediate care sooner if you develop any concerns.   Dr. Erick Alley, DO [facility]

## 2023-06-24 DIAGNOSIS — G4700 Insomnia, unspecified: Secondary | ICD-10-CM | POA: Insufficient documentation

## 2023-06-24 DIAGNOSIS — R5383 Other fatigue: Secondary | ICD-10-CM | POA: Insufficient documentation

## 2023-06-24 DIAGNOSIS — R61 Generalized hyperhidrosis: Secondary | ICD-10-CM | POA: Insufficient documentation

## 2023-06-24 NOTE — Assessment & Plan Note (Signed)
Night sweats, fatigue, feeling foggy could be related to postmenopausal symptoms although she is now ~3 years out.  Fatigue and feeling foggy more likely related to insomnia.  Will rule out anemia and thyroid disorders contributing factors.  Will check CBC with diff to look for any signs of hematologic malignancies. -CBC -TSH -CTM, will consider further workup with CXR and TB testing although infectious etiology is unlikely in setting of no fevers or weight loss.

## 2023-06-24 NOTE — Assessment & Plan Note (Signed)
Shared decision making used, will trial trazodone.  Discussed good sleep hygiene.  Can consider anxiety/depression contributing to insomnia.  Currently on Lexapro.  Discussed utilizing psychology [email or web address] to find therapist which pt plans on doing.

## 2023-06-28 LAB — CBC WITH DIFFERENTIAL/PLATELET
Basophils Absolute: 0.1 10*3/uL (ref 0.0–0.2)
Basos: 1 %
EOS (ABSOLUTE): 0.1 10*3/uL (ref 0.0–0.4)
Eos: 1 %
Hematocrit: 43.1 % (ref 34.0–46.6)
Hemoglobin: 14.5 g/dL (ref 11.1–15.9)
Immature Grans (Abs): 0 10*3/uL (ref 0.0–0.1)
Immature Granulocytes: 0 %
Lymphocytes Absolute: 1.7 10*3/uL (ref 0.7–3.1)
Lymphs: 33 %
MCH: 30.9 pg (ref 26.6–33.0)
MCHC: 33.6 g/dL (ref 31.5–35.7)
MCV: 92 fL (ref 79–97)
Monocytes Absolute: 0.3 10*3/uL (ref 0.1–0.9)
Monocytes: 6 %
Neutrophils Absolute: 3.2 10*3/uL (ref 1.4–7.0)
Neutrophils: 59 %
Platelets: 303 10*3/uL (ref 150–450)
RBC: 4.69 x10E6/uL (ref 3.77–5.28)
RDW: 12.8 % (ref 11.7–15.4)
WBC: 5.3 10*3/uL (ref 3.4–10.8)

## 2023-06-28 LAB — TSH

## 2023-06-29 ENCOUNTER — Other Ambulatory Visit: Payer: Self-pay | Admitting: Student

## 2023-06-29 ENCOUNTER — Encounter: Payer: Self-pay | Admitting: Student

## 2023-06-29 DIAGNOSIS — R5383 Other fatigue: Secondary | ICD-10-CM

## 2023-06-29 NOTE — Progress Notes (Signed)
Lab unable to do TSH. Will re-order for future visit.

## 2023-06-30 NOTE — Telephone Encounter (Signed)
Called patient and scheduled lab visit for tomorrow morning, 8/22.  Veronda Prude, RN

## 2023-07-01 ENCOUNTER — Other Ambulatory Visit: Payer: Managed Care, Other (non HMO)

## 2023-08-10 ENCOUNTER — Other Ambulatory Visit: Payer: Self-pay | Admitting: Student

## 2023-08-10 DIAGNOSIS — R739 Hyperglycemia, unspecified: Secondary | ICD-10-CM

## 2023-08-30 ENCOUNTER — Other Ambulatory Visit: Payer: Self-pay | Admitting: Student

## 2023-09-20 ENCOUNTER — Other Ambulatory Visit: Payer: Self-pay | Admitting: Obstetrics and Gynecology

## 2023-10-01 ENCOUNTER — Encounter (HOSPITAL_COMMUNITY): Payer: Self-pay

## 2023-10-01 NOTE — Pre-Procedure Instructions (Signed)
Surgical Instructions   Your procedure is scheduled on Monday, December 2nd. Report to [facility] Main Entrance "A" at 12:00 P.M., then check in with the Admitting office. Any questions or running late day of surgery: call [phone]  Questions prior to your surgery date: call [phone], Monday-Friday, 8am-4pm. If you experience any cold or flu symptoms such as cough, fever, chills, shortness of breath, etc. between now and your scheduled surgery, please notify us at the above number.     Remember:  Do not eat after midnight the night before your surgery   You may drink clear liquids until 11:00 AM the morning of your surgery.   Clear liquids allowed are: Water, Non-Citrus Juices (without pulp), Carbonated Beverages, Clear Tea (no milk, honey, etc.), Black Coffee Only (NO MILK, CREAM OR POWDERED CREAMER of any kind), and Gatorade.    Take these medicines the morning of surgery with A SIP OF WATER  escitalopram (LEXAPRO)    May take these medicines IF NEEDED: acetaminophen (TYLENOL)    One week prior to surgery, STOP taking any Aspirin (unless otherwise instructed by your surgeon) Aleve, Naproxen, Ibuprofen, Motrin, Advil, Goody's, BC's, all herbal medications, fish oil, and non-prescription vitamins.    Do not take metFORMIN (GLUCOPHAGE-XR) the morning of surgery.            Do NOT Smoke (Tobacco/Vaping) for 24 hours prior to your procedure.  If you use a CPAP at night, you may bring your mask/headgear for your overnight stay.   You will be asked to remove any contacts, glasses, piercing's, hearing aid's, dentures/partials prior to surgery. Please bring cases for these items if needed.    Patients discharged the day of surgery will not be allowed to drive home, and someone needs to stay with them for 24 hours.  SURGICAL WAITING ROOM VISITATION Patients may have no more than 2 support people in the waiting area - these visitors may rotate.   Pre-op nurse will coordinate  an appropriate time for 1 ADULT support person, who may not rotate, to accompany patient in pre-op.  Children under the age of 5 must have an adult with them who is not the patient and must remain in the main waiting area with an adult.  If the patient needs to stay at the hospital during part of their recovery, the visitor guidelines for inpatient rooms apply.  Please refer to the [facility] website for the visitor guidelines for any additional information.   If you received a COVID test during your pre-op visit  it is requested that you wear a mask when out in public, stay away from anyone that may not be feeling well and notify your surgeon if you develop symptoms. If you have been in contact with anyone that has tested positive in the last 10 days please notify you surgeon.      Pre-operative CHG Bathing Instructions   You can play a key role in reducing the risk of infection after surgery. Your skin needs to be as free of germs as possible. You can reduce the number of germs on your skin by washing with CHG (chlorhexidine gluconate) soap before surgery. CHG is an antiseptic soap that kills germs and continues to kill germs even after washing.   DO NOT use if you have an allergy to chlorhexidine/CHG or antibacterial soaps. If your skin becomes reddened or irritated, stop using the CHG and notify one of our RNs at [phone].  TAKE A SHOWER THE NIGHT BEFORE SURGERY AND THE DAY OF SURGERY    Please keep in mind the following:  DO NOT shave, including legs and underarms, 48 hours prior to surgery.   You may shave your face before/day of surgery.  Place clean sheets on your bed the night before surgery Use a clean washcloth (not used since being washed) for each shower. DO NOT sleep with pet's night before surgery.  CHG Shower Instructions:  Wash your face and private area with normal soap. If you choose to wash your hair, wash first with your normal shampoo.  After you  use shampoo/soap, rinse your hair and body thoroughly to remove shampoo/soap residue.  Turn the water OFF and apply half the bottle of CHG soap to a CLEAN washcloth.  Apply CHG soap ONLY FROM YOUR NECK DOWN TO YOUR TOES (washing for 3-5 minutes)  DO NOT use CHG soap on face, private areas, open wounds, or sores.  Pay special attention to the area where your surgery is being performed.  If you are having back surgery, having someone wash your back for you may be helpful. Wait 2 minutes after CHG soap is applied, then you may rinse off the CHG soap.  Pat dry with a clean towel  Put on clean pajamas    Additional instructions for the day of surgery: DO NOT APPLY any lotions, deodorants, cologne, or perfumes.   Do not wear jewelry or makeup Do not wear nail polish, gel polish, artificial nails, or any other type of covering on natural nails (fingers and toes) Do not bring valuables to the hospital. [facility] is not responsible for valuables/personal belongings. Put on clean/comfortable clothes.  Please brush your teeth.  Ask your nurse before applying any prescription medications to the skin.

## 2023-10-04 ENCOUNTER — Other Ambulatory Visit: Payer: Self-pay

## 2023-10-04 ENCOUNTER — Encounter (HOSPITAL_COMMUNITY): Payer: Self-pay

## 2023-10-04 ENCOUNTER — Encounter (HOSPITAL_COMMUNITY)
Admission: RE | Admit: 2023-10-04 | Discharge: 2023-10-04 | Disposition: A | Discharge status: Home / Self Care | Payer: 59 | Source: Ambulatory Visit | Attending: Obstetrics and Gynecology | Admitting: Obstetrics and Gynecology

## 2023-10-04 VITALS — BP 131/60 | HR 78 | Temp 98.0°F | Resp 18 | Ht 63.0 in | Wt 234.5 lb

## 2023-10-04 DIAGNOSIS — Z01818 Encounter for other preprocedural examination: Secondary | ICD-10-CM | POA: Diagnosis present

## 2023-10-04 HISTORY — DX: Essential (primary) hypertension: I10

## 2023-10-04 LAB — CBC
HCT: 42.7 % (ref 36.0–46.0)
Hemoglobin: 13.7 g/dL (ref 12.0–15.0)
MCH: 29.9 pg (ref 26.0–34.0)
MCHC: 32.1 g/dL (ref 30.0–36.0)
MCV: 93.2 fL (ref 80.0–100.0)
Platelets: 351 10*3/uL (ref 150–400)
RBC: 4.58 MIL/uL (ref 3.87–5.11)
RDW: 12.3 % (ref 11.5–15.5)
WBC: 5.6 10*3/uL (ref 4.0–10.5)
nRBC: 0 % (ref 0.0–0.2)

## 2023-10-04 LAB — BASIC METABOLIC PANEL
Anion gap: 7 (ref 5–15)
BUN: 16 mg/dL (ref 6–20)
CO2: 27 mmol/L (ref 22–32)
Calcium: 9.5 mg/dL (ref 8.9–10.3)
Chloride: 101 mmol/L (ref 98–111)
Creatinine, Ser: 0.71 mg/dL (ref 0.44–1.00)
GFR, Estimated: >60 mL/min (ref >60)
Glucose, Bld: 105 mg/dL — ABNORMAL HIGH (ref 70–99)
Potassium: 3.9 mmol/L (ref 3.5–5.1)
Sodium: 135 mmol/L (ref 135–145)

## 2023-10-04 LAB — TYPE AND SCREEN
ABO/RH(D): O POS
Antibody Screen: NEGATIVE

## 2023-10-04 LAB — SURGICAL PCR SCREEN
MRSA, PCR: NEGATIVE
Staphylococcus aureus: NEGATIVE

## 2023-10-04 NOTE — Progress Notes (Addendum)
PCP - Dr. Erick Alley Cardiologist - DENIES  PPM/ICD - DENIES Device Orders - N/A Rep Notified - N/A  Chest x-ray - N/A EKG - 10/04/23 Stress Test - DENIES ECHO - DENIES Cardiac Cath - DENIES  Sleep Study - DENIES CPAP - N/A  Fasting Blood Sugar - DENIES Checks Blood Sugar _____ times a day: N/A  Last dose of GLP1 agonist-  DENIES GLP1 instructions: N/A  Blood Thinner Instructions: Y Aspirin Instructions: Y  ERAS Protcol - Y PRE-SURGERY Ensure or G2- NONE  COVID TEST- N/A   Anesthesia review:  Y, Cardiac hx (Hypertension)    Patient denies shortness of breath, fever, cough and chest pain at PAT appointment. Patient denies any respiratory illness or issues with herself or her family living with her.   All instructions explained to the patient, with a verbal understanding of the material. Patient agrees to go over the instructions while at home for a better understanding. Patient also instructed to self quarantine after being tested for COVID-19. The opportunity to ask questions was provided.

## 2023-10-05 NOTE — Progress Notes (Signed)
Anesthesia Chart Review:  Case: [id number] Date/Time: 10/11/23 1350   Procedures:      LAPAROSCOPIC ASSISTED VAGINAL HYSTERECTOMY WITH SALPINGECTOMY (Bilateral)     ANTERIOR REPAIR (CYSTOCELE)     POSSIBLE EXPLORATORY LAPAROTOMY     HYSTERECTOMY VAGINAL (Bilateral)   Anesthesia type: General   Pre-op diagnosis: Uterine prolapse   Location: [facility] ROOM 08 / [facility] OR   Surgeons: Osborn Coho, MD       DISCUSSION: Patient is a 52 year old female scheduled for the above procedure.   History includes former smoker, HTN, cholecystectomy (01/17/04). BMI is consistent with obesity. She is on metformin for weight loss per GYN notes.  Anesthesia team to evaluate on the day of surgery.  VS: BP 131/60   Pulse 78   Temp 36.7 C   Resp 18   Ht 5\' 3"  (1.6 m)   Wt 106.4 kg   SpO2 99%   BMI 41.54 kg/m   PROVIDERS: Erick Alley, DO is PCP    LABS: Labs reviewed: Acceptable for surgery. (all labs ordered are listed, but only abnormal results are displayed)  Labs Reviewed  BASIC METABOLIC PANEL - Abnormal; Notable for the following components:      Result Value   Glucose, Bld 105 (*)    All other components within normal limits  SURGICAL PCR SCREEN  CBC  TYPE AND SCREEN    EKG: 10/04/23: Sinus rhythm with sinus arrhythmia   CV: N/A  Past Medical History:  Diagnosis Date   Hypertension    Stress-related symptoms 12/31/2017   Assessment/Plan Recommendations:  Dalyah presented today with some mild depressive(PHQ9 =9, denied SI)  and anxiety symptoms (GAD7=9). She continues to experience stressors at home, however, she has experienced some reduction in concentration troubles and frustration, which she attributes to starting to take Celexa 2 weeks ago.  She has not consistently practiced deep breathing but reported when    Past Surgical History:  Procedure Laterality Date   GALLBLADDER SURGERY      MEDICATIONS:  acetaminophen (TYLENOL) 500 MG tablet   acidophilus (RISAQUAD)  CAPS capsule   escitalopram (LEXAPRO) 20 MG tablet   hydrochlorothiazide (HYDRODIURIL) 12.5 MG tablet   losartan (COZAAR) 50 MG tablet   metFORMIN (GLUCOPHAGE-XR) 500 MG 24 hr tablet   traZODone (DESYREL) 50 MG tablet   No current facility-administered medications for this encounter.    Shonna Chock, PA-C Surgical Short Stay/Anesthesiology [facility] Phone [phone] [facility] Phone [phone] 10/05/2023 1:19 PM

## 2023-10-05 NOTE — Anesthesia Preprocedure Evaluation (Addendum)
Anesthesia Evaluation  Patient identified by MRN, date of birth, ID band Patient awake    Reviewed: Allergy & Precautions, NPO status , Patient's Chart, lab work & pertinent test results  History of Anesthesia Complications Negative for: history of anesthetic complications  Airway Mallampati: I  TM Distance: >3 FB Neck ROM: Full    Dental  (+) Dental Advisory Given   Pulmonary former smoker   breath sounds clear to auscultation       Cardiovascular hypertension, Pt. on medications (-) angina  Rhythm:Regular Rate:Normal     Neuro/Psych    Depression    negative neurological ROS     GI/Hepatic negative GI ROS, Neg liver ROS,,,  Endo/Other  diabetes, Oral Hypoglycemic Agents  BMI 41.6  Renal/GU negative Renal ROS     Musculoskeletal   Abdominal   Peds  Hematology negative hematology ROS (+)   Anesthesia Other Findings   Reproductive/Obstetrics                              Anesthesia Physical Anesthesia Plan  ASA: 3  Anesthesia Plan: General   Post-op Pain Management: Tylenol PO (pre-op)*   Induction: Intravenous  PONV Risk Score and Plan: 3 and Ondansetron, Dexamethasone and Scopolamine patch - Pre-op  Airway Management Planned: Oral ETT  Additional Equipment: None  Intra-op Plan:   Post-operative Plan: Extubation in OR  Informed Consent: I have reviewed the patients History and Physical, chart, labs and discussed the procedure including the risks, benefits and alternatives for the proposed anesthesia with the patient or authorized representative who has indicated his/her understanding and acceptance.     Dental advisory given  Plan Discussed with: CRNA and Surgeon  Anesthesia Plan Comments: (PAT note written 10/05/2023 by Shonna Chock, PA-C.  )        Anesthesia Quick Evaluation

## 2023-10-11 ENCOUNTER — Ambulatory Visit (HOSPITAL_BASED_OUTPATIENT_CLINIC_OR_DEPARTMENT_OTHER): Payer: 59 | Admitting: Anesthesiology

## 2023-10-11 ENCOUNTER — Other Ambulatory Visit: Payer: Self-pay

## 2023-10-11 ENCOUNTER — Ambulatory Visit (HOSPITAL_COMMUNITY)
Admission: RE | Admit: 2023-10-11 | Discharge: 2023-10-12 | Disposition: A | Discharge status: Home / Self Care | Payer: 59 | Attending: Obstetrics and Gynecology | Admitting: Obstetrics and Gynecology

## 2023-10-11 ENCOUNTER — Encounter (HOSPITAL_COMMUNITY)
Admission: RE | Disposition: A | Discharge status: Home / Self Care | Payer: Self-pay | Source: Home / Self Care | Attending: Obstetrics and Gynecology

## 2023-10-11 ENCOUNTER — Ambulatory Visit (HOSPITAL_COMMUNITY): Payer: 59 | Admitting: Vascular Surgery

## 2023-10-11 DIAGNOSIS — N814 Uterovaginal prolapse, unspecified: Secondary | ICD-10-CM

## 2023-10-11 DIAGNOSIS — Z79899 Other long term (current) drug therapy: Secondary | ICD-10-CM | POA: Diagnosis not present

## 2023-10-11 DIAGNOSIS — Z7984 Long term (current) use of oral hypoglycemic drugs: Secondary | ICD-10-CM | POA: Diagnosis not present

## 2023-10-11 DIAGNOSIS — Z90710 Acquired absence of both cervix and uterus: Secondary | ICD-10-CM | POA: Diagnosis present

## 2023-10-11 DIAGNOSIS — Z87891 Personal history of nicotine dependence: Secondary | ICD-10-CM | POA: Insufficient documentation

## 2023-10-11 DIAGNOSIS — F32A Depression, unspecified: Secondary | ICD-10-CM | POA: Insufficient documentation

## 2023-10-11 DIAGNOSIS — D251 Intramural leiomyoma of uterus: Secondary | ICD-10-CM | POA: Diagnosis not present

## 2023-10-11 DIAGNOSIS — N8189 Other female genital prolapse: Secondary | ICD-10-CM | POA: Insufficient documentation

## 2023-10-11 DIAGNOSIS — I10 Essential (primary) hypertension: Secondary | ICD-10-CM | POA: Diagnosis not present

## 2023-10-11 DIAGNOSIS — E119 Type 2 diabetes mellitus without complications: Secondary | ICD-10-CM | POA: Diagnosis not present

## 2023-10-11 DIAGNOSIS — Z01818 Encounter for other preprocedural examination: Secondary | ICD-10-CM

## 2023-10-11 HISTORY — PX: LAPAROSCOPIC VAGINAL HYSTERECTOMY WITH SALPINGO OOPHORECTOMY: SHX6681

## 2023-10-11 HISTORY — PX: ANTERIOR AND POSTERIOR REPAIR: SHX5121

## 2023-10-11 HISTORY — PX: CYSTOSCOPY: SHX5120

## 2023-10-11 LAB — GLUCOSE, CAPILLARY: Glucose-Capillary: 169 mg/dL — ABNORMAL HIGH (ref 70–99)

## 2023-10-11 LAB — ABO/RH: ABO/RH(D): O POS

## 2023-10-11 SURGERY — LAPAROSCOPIC ASSISTED VAGINAL HYSTERECTOMY WITH SALPINGO OOPHORECTOMY
Anesthesia: General | Site: Vagina

## 2023-10-11 MED ORDER — ROCURONIUM BROMIDE 10 MG/ML (PF) SYRINGE
PREFILLED_SYRINGE | INTRAVENOUS | Status: AC
  Filled 2023-10-11: qty 10

## 2023-10-11 MED ORDER — DEXMEDETOMIDINE HCL IN NACL 80 MCG/20ML IV SOLN
INTRAVENOUS | Status: AC
  Filled 2023-10-11: qty 20

## 2023-10-11 MED ORDER — CEFAZOLIN SODIUM-DEXTROSE 2-3 GM-%(50ML) IV SOLR
INTRAVENOUS | Status: DC | PRN
  Administered 2023-10-11 (×2): 2 g via INTRAVENOUS

## 2023-10-11 MED ORDER — ONDANSETRON HCL 4 MG/2ML IJ SOLN: 4.0000 mg | Freq: Four times a day (QID) | INTRAMUSCULAR | Status: DC | PRN

## 2023-10-11 MED ORDER — ACETAMINOPHEN 500 MG PO TABS
1000.0000 mg | ORAL_TABLET | Freq: Four times a day (QID) | ORAL | Status: DC
  Administered 2023-10-12 (×3): 1000 mg via ORAL
  Filled 2023-10-11 (×3): qty 2

## 2023-10-11 MED ORDER — BUPIVACAINE HCL (PF) 0.25 % IJ SOLN
INTRAMUSCULAR | Status: DC | PRN
  Administered 2023-10-11: 11 mL

## 2023-10-11 MED ORDER — DEXMEDETOMIDINE HCL IN NACL 80 MCG/20ML IV SOLN
INTRAVENOUS | Status: DC | PRN
  Administered 2023-10-11: 8 ug via INTRAVENOUS
  Administered 2023-10-11: 12 ug via INTRAVENOUS

## 2023-10-11 MED ORDER — OXYCODONE HCL 5 MG PO TABS: 5.0000 mg | ORAL_TABLET | Freq: Once | ORAL | Status: DC | PRN

## 2023-10-11 MED ORDER — HEMOSTATIC AGENTS (NO CHARGE) OPTIME
TOPICAL | Status: DC | PRN
  Administered 2023-10-11: 1 via TOPICAL

## 2023-10-11 MED ORDER — LACTATED RINGERS IV SOLN: INTRAVENOUS | Status: DC

## 2023-10-11 MED ORDER — HYDROMORPHONE HCL 1 MG/ML IJ SOLN
INTRAMUSCULAR | Status: AC
  Filled 2023-10-11: qty 1

## 2023-10-11 MED ORDER — ACETAMINOPHEN 10 MG/ML IV SOLN
INTRAVENOUS | Status: DC | PRN
  Administered 2023-10-11: 1000 mg via INTRAVENOUS

## 2023-10-11 MED ORDER — DIPHENHYDRAMINE HCL 12.5 MG/5ML PO ELIX: 12.5000 mg | ORAL_SOLUTION | Freq: Four times a day (QID) | ORAL | Status: DC | PRN

## 2023-10-11 MED ORDER — GLYCOPYRROLATE PF 0.2 MG/ML IJ SOSY
PREFILLED_SYRINGE | INTRAMUSCULAR | Status: AC
  Filled 2023-10-11: qty 1

## 2023-10-11 MED ORDER — SUGAMMADEX SODIUM 200 MG/2ML IV SOLN
INTRAVENOUS | Status: DC | PRN
  Administered 2023-10-11: 225 mg via INTRAVENOUS

## 2023-10-11 MED ORDER — PROPOFOL 10 MG/ML IV BOLUS
INTRAVENOUS | Status: AC
  Filled 2023-10-11: qty 20

## 2023-10-11 MED ORDER — LABETALOL HCL 5 MG/ML IV SOLN
INTRAVENOUS | Status: AC
  Filled 2023-10-11: qty 4

## 2023-10-11 MED ORDER — ROCURONIUM BROMIDE 10 MG/ML (PF) SYRINGE
PREFILLED_SYRINGE | INTRAVENOUS | Status: DC | PRN
  Administered 2023-10-11: 10 mg via INTRAVENOUS
  Administered 2023-10-11: 20 mg via INTRAVENOUS
  Administered 2023-10-11: 10 mg via INTRAVENOUS
  Administered 2023-10-11: 20 mg via INTRAVENOUS
  Administered 2023-10-11: 10 mg via INTRAVENOUS
  Administered 2023-10-11: 70 mg via INTRAVENOUS

## 2023-10-11 MED ORDER — CHLORHEXIDINE GLUCONATE 0.12 % MT SOLN
15.0000 mL | Freq: Once | OROMUCOSAL | Status: AC
  Administered 2023-10-11: 15 mL via OROMUCOSAL
  Filled 2023-10-11: qty 15

## 2023-10-11 MED ORDER — MEPERIDINE HCL 25 MG/ML IJ SOLN: 6.2500 mg | INTRAMUSCULAR | Status: DC | PRN

## 2023-10-11 MED ORDER — FENTANYL CITRATE (PF) 250 MCG/5ML IJ SOLN
INTRAMUSCULAR | Status: AC
  Filled 2023-10-11: qty 5

## 2023-10-11 MED ORDER — SCOPOLAMINE 1 MG/3DAYS TD PT72
1.0000 | MEDICATED_PATCH | TRANSDERMAL | Status: DC
  Administered 2023-10-11: 1.5 mg via TRANSDERMAL
  Filled 2023-10-11: qty 1

## 2023-10-11 MED ORDER — ENOXAPARIN SODIUM 60 MG/0.6ML IJ SOSY
50.0000 mg | PREFILLED_SYRINGE | INTRAMUSCULAR | Status: DC
  Administered 2023-10-12: 50 mg via SUBCUTANEOUS
  Filled 2023-10-11: qty 0.6

## 2023-10-11 MED ORDER — ESCITALOPRAM OXALATE 10 MG PO TABS
20.0000 mg | ORAL_TABLET | Freq: Every day | ORAL | Status: DC
  Administered 2023-10-12: 20 mg via ORAL
  Filled 2023-10-11: qty 2

## 2023-10-11 MED ORDER — NALOXONE HCL 0.4 MG/ML IJ SOLN: 0.4000 mg | INTRAMUSCULAR | Status: DC | PRN

## 2023-10-11 MED ORDER — EPHEDRINE 5 MG/ML INJ
INTRAVENOUS | Status: AC
  Filled 2023-10-11: qty 10

## 2023-10-11 MED ORDER — CEFAZOLIN SODIUM 1 G IJ SOLR
INTRAMUSCULAR | Status: AC
  Filled 2023-10-11: qty 20

## 2023-10-11 MED ORDER — FENTANYL CITRATE (PF) 250 MCG/5ML IJ SOLN
INTRAMUSCULAR | Status: DC | PRN
  Administered 2023-10-11 (×4): 50 ug via INTRAVENOUS
  Administered 2023-10-11 (×2): 100 ug via INTRAVENOUS
  Administered 2023-10-11 (×2): 50 ug via INTRAVENOUS

## 2023-10-11 MED ORDER — HYDROCHLOROTHIAZIDE 25 MG PO TABS
12.5000 mg | ORAL_TABLET | Freq: Every day | ORAL | Status: DC
  Administered 2023-10-12: 12.5 mg via ORAL
  Filled 2023-10-11: qty 1

## 2023-10-11 MED ORDER — GLYCOPYRROLATE PF 0.2 MG/ML IJ SOSY
PREFILLED_SYRINGE | INTRAMUSCULAR | Status: DC | PRN
  Administered 2023-10-11 (×2): .1 mg via INTRAVENOUS
  Administered 2023-10-11: .2 mg via INTRAVENOUS

## 2023-10-11 MED ORDER — LOSARTAN POTASSIUM 25 MG PO TABS: 50.0000 mg | ORAL_TABLET | Freq: Every day | ORAL | Status: DC

## 2023-10-11 MED ORDER — SODIUM CHLORIDE (PF) 0.9 % IJ SOLN: INTRAMUSCULAR | Status: DC | PRN

## 2023-10-11 MED ORDER — MIDAZOLAM HCL 2 MG/2ML IJ SOLN
INTRAMUSCULAR | Status: AC
  Filled 2023-10-11: qty 2

## 2023-10-11 MED ORDER — 0.9 % SODIUM CHLORIDE (POUR BTL) OPTIME
TOPICAL | Status: DC | PRN
  Administered 2023-10-11 (×2): 1000 mL

## 2023-10-11 MED ORDER — ONDANSETRON HCL 4 MG/2ML IJ SOLN
INTRAMUSCULAR | Status: DC | PRN
  Administered 2023-10-11: 4 mg via INTRAVENOUS

## 2023-10-11 MED ORDER — SODIUM CHLORIDE 0.9% FLUSH: 9.0000 mL | INTRAVENOUS | Status: DC | PRN

## 2023-10-11 MED ORDER — DEXAMETHASONE SODIUM PHOSPHATE 10 MG/ML IJ SOLN
INTRAMUSCULAR | Status: DC | PRN
  Administered 2023-10-11: 5 mg via INTRAVENOUS

## 2023-10-11 MED ORDER — SODIUM CHLORIDE (PF) 0.9 % IJ SOLN
INTRAMUSCULAR | Status: AC
  Filled 2023-10-11: qty 50

## 2023-10-11 MED ORDER — ONDANSETRON HCL 4 MG/2ML IJ SOLN
INTRAMUSCULAR | Status: AC
  Filled 2023-10-11: qty 2

## 2023-10-11 MED ORDER — LABETALOL HCL 5 MG/ML IV SOLN
INTRAVENOUS | Status: DC | PRN
  Administered 2023-10-11: 10 mg via INTRAVENOUS

## 2023-10-11 MED ORDER — LIDOCAINE 2% (20 MG/ML) 5 ML SYRINGE
INTRAMUSCULAR | Status: AC
  Filled 2023-10-11: qty 5

## 2023-10-11 MED ORDER — ACETAMINOPHEN 10 MG/ML IV SOLN
INTRAVENOUS | Status: AC
  Filled 2023-10-11: qty 100

## 2023-10-11 MED ORDER — DEXAMETHASONE SODIUM PHOSPHATE 10 MG/ML IJ SOLN
INTRAMUSCULAR | Status: AC
Start: 2023-10-11 — End: ?
  Filled 2023-10-11: qty 1

## 2023-10-11 MED ORDER — HYDROMORPHONE 1 MG/ML IV SOLN: INTRAVENOUS | Status: DC

## 2023-10-11 MED ORDER — BUPIVACAINE HCL (PF) 0.25 % IJ SOLN
INTRAMUSCULAR | Status: AC
  Filled 2023-10-11: qty 30

## 2023-10-11 MED ORDER — OXYCODONE HCL 5 MG PO TABS
5.0000 mg | ORAL_TABLET | ORAL | Status: DC | PRN
Start: 2023-10-11 — End: 2023-10-12
  Administered 2023-10-12: 5 mg via ORAL
  Filled 2023-10-11: qty 1

## 2023-10-11 MED ORDER — PROPOFOL 10 MG/ML IV BOLUS
INTRAVENOUS | Status: DC | PRN
  Administered 2023-10-11: 150 mg via INTRAVENOUS

## 2023-10-11 MED ORDER — ORAL CARE MOUTH RINSE: 15.0000 mL | Freq: Once | OROMUCOSAL | Status: AC

## 2023-10-11 MED ORDER — EPHEDRINE SULFATE-NACL 50-0.9 MG/10ML-% IV SOSY
PREFILLED_SYRINGE | INTRAVENOUS | Status: DC | PRN
  Administered 2023-10-11: 10 mg via INTRAVENOUS
  Administered 2023-10-11: 5 mg via INTRAVENOUS

## 2023-10-11 MED ORDER — DIPHENHYDRAMINE HCL 50 MG/ML IJ SOLN: 12.5000 mg | Freq: Four times a day (QID) | INTRAMUSCULAR | Status: DC | PRN

## 2023-10-11 MED ORDER — GLYCOPYRROLATE PF 0.2 MG/ML IJ SOSY
PREFILLED_SYRINGE | INTRAMUSCULAR | Status: AC
  Filled 2023-10-11: qty 2

## 2023-10-11 MED ORDER — VASOPRESSIN 20 UNIT/ML IV SOLN
INTRAVENOUS | Status: DC | PRN
  Administered 2023-10-11: 17 mL via INTRAMUSCULAR
  Administered 2023-10-11: 9 mL via INTRAMUSCULAR

## 2023-10-11 MED ORDER — VASOPRESSIN 20 UNIT/ML IV SOLN
INTRAVENOUS | Status: AC
Start: 2023-10-11 — End: ?
  Filled 2023-10-11: qty 1

## 2023-10-11 MED ORDER — MIDAZOLAM HCL 2 MG/2ML IJ SOLN
INTRAMUSCULAR | Status: DC | PRN
  Administered 2023-10-11: 2 mg via INTRAVENOUS

## 2023-10-11 MED ORDER — SODIUM CHLORIDE 0.9 % IR SOLN
Status: DC | PRN
  Administered 2023-10-11 (×2): 1000 mL

## 2023-10-11 MED ORDER — ESTRADIOL 0.1 MG/GM VA CREA
TOPICAL_CREAM | VAGINAL | Status: AC
  Filled 2023-10-11: qty 42.5

## 2023-10-11 MED ORDER — HYDROMORPHONE HCL 1 MG/ML IJ SOLN
0.2500 mg | INTRAMUSCULAR | Status: DC | PRN
  Administered 2023-10-11 (×4): 0.5 mg via INTRAVENOUS

## 2023-10-11 MED ORDER — MIDAZOLAM HCL 2 MG/2ML IJ SOLN: 0.5000 mg | Freq: Once | INTRAMUSCULAR | Status: DC | PRN

## 2023-10-11 MED ORDER — LIDOCAINE 2% (20 MG/ML) 5 ML SYRINGE
INTRAMUSCULAR | Status: DC | PRN
  Administered 2023-10-11: 40 mg via INTRAVENOUS

## 2023-10-11 MED ORDER — IBUPROFEN 600 MG PO TABS: 600.0000 mg | ORAL_TABLET | Freq: Four times a day (QID) | ORAL | Status: DC

## 2023-10-11 MED ORDER — ACETAMINOPHEN 500 MG PO TABS
1000.0000 mg | ORAL_TABLET | Freq: Once | ORAL | Status: AC
  Administered 2023-10-11: 1000 mg via ORAL
  Filled 2023-10-11: qty 2

## 2023-10-11 MED ORDER — KETOROLAC TROMETHAMINE 30 MG/ML IJ SOLN
30.0000 mg | Freq: Four times a day (QID) | INTRAMUSCULAR | Status: DC
  Administered 2023-10-11 – 2023-10-12 (×3): 30 mg via INTRAVENOUS
  Filled 2023-10-11 (×3): qty 1

## 2023-10-11 MED ORDER — OXYCODONE HCL 5 MG/5ML PO SOLN: 5.0000 mg | Freq: Once | ORAL | Status: DC | PRN

## 2023-10-11 SURGICAL SUPPLY — 92 items
APPLICATOR ARISTA FLEXITIP XL (MISCELLANEOUS) ×2 IMPLANT
BAG COUNTER SPONGE SURGICOUNT (BAG) ×6 IMPLANT
BARRIER ADHS 3X4 INTERCEED (GAUZE/BANDAGES/DRESSINGS) IMPLANT
BLADE SURG 10 STRL SS (BLADE) ×6 IMPLANT
BLADE SURG 15 STRL LF DISP TIS (BLADE) ×2 IMPLANT
BLADE SURG 15 STRL SS SAFETY (BLADE) ×2 IMPLANT
CANISTER SUCT 3000ML PPV (MISCELLANEOUS) ×4 IMPLANT
COUNTER NDL 20CT MAGNET RED (NEEDLE) ×2 IMPLANT
COVER BACK TABLE 60X90IN (DRAPES) ×4 IMPLANT
COVER MAYO STAND STRL (DRAPES) ×4 IMPLANT
DERMABOND ADVANCED .7 DNX12 (GAUZE/BANDAGES/DRESSINGS) ×4 IMPLANT
DRAPE SHEET LG 3/4 BI-LAMINATE (DRAPES) ×8 IMPLANT
DRAPE STERI URO 9X17 APER PCH (DRAPES) ×4 IMPLANT
DRAPE WARM FLUID 44X44 (DRAPES) ×2 IMPLANT
DURAPREP 26ML APPLICATOR (WOUND CARE) ×8 IMPLANT
ELECT CAUTERY BLADE 6.4 (BLADE) ×2 IMPLANT
ELECT REM PT RETURN 9FT ADLT (ELECTROSURGICAL) ×3 IMPLANT
ELECTRODE REM PT RTRN 9FT ADLT (ELECTROSURGICAL) ×2 IMPLANT
FORCEPS CUTTING 33CM 5MM (CUTTING FORCEPS) ×4 IMPLANT
GAUZE 4X4 16PLY ~~LOC~~+RFID DBL (SPONGE) ×4 IMPLANT
GAUZE STRIP PACKING 2INX5YD (MISCELLANEOUS) ×4 IMPLANT
GLOVE BIO SURGEON STRL SZ7.5 (GLOVE) ×6 IMPLANT
GLOVE BIOGEL PI IND STRL 6.5 (GLOVE) ×4 IMPLANT
GLOVE BIOGEL PI IND STRL 7.0 (GLOVE) ×6 IMPLANT
GLOVE BIOGEL PI IND STRL 7.5 (GLOVE) ×10 IMPLANT
GLOVE SURG UNDER POLY LF SZ7 (GLOVE) ×8 IMPLANT
GOWN STRL REUS W/ TWL LRG LVL3 (GOWN DISPOSABLE) ×18 IMPLANT
HEMOSTAT ARISTA ABSORB 3G PWDR (HEMOSTASIS) ×2 IMPLANT
HEMOSTAT SURGICEL 4X8 (HEMOSTASIS) IMPLANT
HIBICLENS CHG 4% 4OZ BTL (MISCELLANEOUS) ×6 IMPLANT
IRRIG SUCT STRYKERFLOW 2 WTIP (MISCELLANEOUS) ×3 IMPLANT
IRRIGATION SUCT STRKRFLW 2 WTP (MISCELLANEOUS) IMPLANT
KIT PINK PAD W/HEAD ARE REST (MISCELLANEOUS) ×3 IMPLANT
KIT PINK PAD W/HEAD ARM REST (MISCELLANEOUS) ×2 IMPLANT
KIT TURNOVER KIT B (KITS) ×4 IMPLANT
LEGGING LITHOTOMY PAIR STRL (DRAPES) ×4 IMPLANT
LIGASURE VESSEL 5MM BLUNT TIP (ELECTROSURGICAL) ×2 IMPLANT
NDL HYPO 22X1.5 SAFETY MO (MISCELLANEOUS) ×4 IMPLANT
NDL INSUFFLATION 14GA 120MM (NEEDLE) IMPLANT
NDL MAYO CATGUT SZ4 TPR NDL (NEEDLE) IMPLANT
NEEDLE HYPO 22X1.5 SAFETY MO (MISCELLANEOUS) ×3 IMPLANT
NEEDLE INSUFFLATION 14GA 120MM (NEEDLE) IMPLANT
NEEDLE MAYO CATGUT SZ4 (NEEDLE) IMPLANT
NS IRRIG 1000ML POUR BTL (IV SOLUTION) ×4 IMPLANT
PACK ABDOMINAL GYN (CUSTOM PROCEDURE TRAY) ×4 IMPLANT
PACK LAVH (CUSTOM PROCEDURE TRAY) ×4 IMPLANT
PACK ROBOTIC GOWN (GOWN DISPOSABLE) ×4 IMPLANT
PACK VAGINAL WOMENS (CUSTOM PROCEDURE TRAY) ×2 IMPLANT
PAD OB MATERNITY 4.3X12.25 (PERSONAL CARE ITEMS) ×4 IMPLANT
PAD POSITIONING PINK XL (MISCELLANEOUS) ×4 IMPLANT
PENCIL BUTTON HOLSTER BLD 10FT (ELECTRODE) ×4 IMPLANT
PROTECTOR NERVE ULNAR (MISCELLANEOUS) ×4 IMPLANT
SCISSORS LAP 5X35 DISP (ENDOMECHANICALS) IMPLANT
SET CYSTO W/LG BORE CLAMP LF (SET/KITS/TRAYS/PACK) ×4 IMPLANT
SET TUBE SMOKE EVAC HIGH FLOW (TUBING) ×4 IMPLANT
SHEARS HARMONIC ACE PLUS 36CM (ENDOMECHANICALS) IMPLANT
SLEEVE ADV FIXATION 5X100MM (TROCAR) ×4 IMPLANT
SOL ELECTROSURG ANTI STICK (MISCELLANEOUS) ×6 IMPLANT
SOLUTION ELECTROSURG ANTI STCK (MISCELLANEOUS) ×2 IMPLANT
SPECIMEN JAR MEDIUM (MISCELLANEOUS) ×2 IMPLANT
SPIKE FLUID TRANSFER (MISCELLANEOUS) ×4 IMPLANT
SPONGE INTESTINAL PEANUT (DISPOSABLE) IMPLANT
SPONGE T-LAP 18X18 ~~LOC~~+RFID (SPONGE) ×4 IMPLANT
STAPLER VISISTAT 35W (STAPLE) IMPLANT
SUT CHROMIC 2 0 CT 1 (SUTURE) ×4 IMPLANT
SUT CHROMIC 2 0 SH (SUTURE) IMPLANT
SUT MNCRL AB 3-0 PS2 27 (SUTURE) ×4 IMPLANT
SUT MON AB 3-0 SH27 (SUTURE) IMPLANT
SUT MON AB 4-0 PS1 27 (SUTURE) ×2 IMPLANT
SUT PDS AB 1 CTX 36 (SUTURE) IMPLANT
SUT PLAIN 2 0 XLH (SUTURE) ×2 IMPLANT
SUT SILK 3 0 TIES 10X30 (SUTURE) IMPLANT
SUT VIC AB 0 CT1 18XCR BRD8 (SUTURE) ×12 IMPLANT
SUT VIC AB 0 CT1 27XBRD ANBCTR (SUTURE) ×8 IMPLANT
SUT VIC AB 0 CT1 36 (SUTURE) ×2 IMPLANT
SUT VIC AB 1 CT1 36 (SUTURE) IMPLANT
SUT VIC AB 2-0 CT1 TAPERPNT 27 (SUTURE) ×16 IMPLANT
SUT VIC AB 2-0 SH 27XBRD (SUTURE) ×16 IMPLANT
SUT VIC AB 3-0 SH 27X BRD (SUTURE) ×2 IMPLANT
SUT VICRYL 0 TIES 12 18 (SUTURE) ×2 IMPLANT
SUT VICRYL 0 UR6 27IN ABS (SUTURE) ×4 IMPLANT
SYR BULB IRRIG 60ML STRL (SYRINGE) ×4 IMPLANT
SYR CONTROL 10ML LL (SYRINGE) ×2 IMPLANT
SYR TB 1ML LUER SLIP (SYRINGE) ×4 IMPLANT
TOWEL GREEN STERILE FF (TOWEL DISPOSABLE) ×8 IMPLANT
TRAY FOLEY W/BAG SLVR 14FR (SET/KITS/TRAYS/PACK) ×4 IMPLANT
TRAY FOLEY W/BAG SLVR 16FR ST (SET/KITS/TRAYS/PACK) ×4 IMPLANT
TROCAR ADV FIXATION 11X100MM (TROCAR) ×2 IMPLANT
TROCAR ADV FIXATION 5X100MM (TROCAR) ×2 IMPLANT
TROCAR BALLN 12MMX100 BLUNT (TROCAR) IMPLANT
UNDERPAD 30X36 HEAVY ABSORB (UNDERPADS AND DIAPERS) ×4 IMPLANT
WARMER LAPAROSCOPE (MISCELLANEOUS) ×4 IMPLANT

## 2023-10-11 NOTE — Transfer of Care (Signed)
Immediate Anesthesia Transfer of Care Note  Patient: Kristen Carney  Procedure(s) Performed: LAPAROSCOPIC ASSISTED VAGINAL HYSTERECTOMY WITH SALPINGO OOPHORECTOMY (Bilateral) ANTERIOR (CYSTOCELE) AND POSTERIOR REPAIR (RECTOCELE) CYSTOSCOPY (Vagina )  Patient Location: PACU  Anesthesia Type:General  Level of Consciousness: drowsy  Airway & Oxygen Therapy: Patient Spontanous Breathing and Patient connected to face mask oxygen  Post-op Assessment: Report given to RN and Post -op Vital signs reviewed and stable  Post vital signs: Reviewed and stable  Last Vitals:  Vitals Value Taken Time  BP 130/50 10/11/23 2023  Temp    Pulse 74 10/11/23 2030  Resp 17 10/11/23 2030  SpO2 97 % 10/11/23 2030  Vitals shown include unfiled device data.  Last Pain:  Vitals:   10/11/23 1228  TempSrc:   PainSc: 0-No pain         Complications: No notable events documented.

## 2023-10-11 NOTE — H&P (Signed)
Kristen Carney is an 51 y.o. female. Pt known to me and has pelvic relaxation and is presenting for scheduled surgical management.  Pertinent Gynecological History:  OB History: G4, P4   Menstrual History:  No LMP recorded (lmp unknown). Patient is postmenopausal.    Past Medical History:  Diagnosis Date   Hypertension    Stress-related symptoms 12/31/2017   Assessment/Plan Recommendations:  Madelena presented today with some mild depressive(PHQ9 =9, denied SI)  and anxiety symptoms (GAD7=9). She continues to experience stressors at home, however, she has experienced some reduction in concentration troubles and frustration, which she attributes to starting to take Celexa 2 weeks ago.  She has not consistently practiced deep breathing but reported when    Past Surgical History:  Procedure Laterality Date   GALLBLADDER SURGERY      No family history on file.  Social History:  reports that she has quit smoking. She has never used smokeless tobacco. She reports that she does not drink alcohol and does not use drugs.  Allergies:  Allergies  Allergen Reactions   Amlodipine Swelling    Lower leg edema    Medications Prior to Admission  Medication Sig Dispense Refill Last Dose   acetaminophen (TYLENOL) 500 MG tablet Take 1,000 mg by mouth every 6 (six) hours as needed for moderate pain (pain score 4-6).   Past Month   acidophilus (RISAQUAD) CAPS capsule Take 1 capsule by mouth in the morning and at bedtime.   Past Week   escitalopram (LEXAPRO) 20 MG tablet TAKE 1 TABLET DAILY 90 tablet 3 10/11/2023 at 0800   hydrochlorothiazide (HYDRODIURIL) 12.5 MG tablet Take 1 tablet (12.5 mg total) by mouth daily. 90 tablet 3 10/10/2023   losartan (COZAAR) 50 MG tablet TAKE 1 TABLET AT BEDTIME 90 tablet 3 10/10/2023   metFORMIN (GLUCOPHAGE-XR) 500 MG 24 hr tablet INCREASE MEDICATION AS TOLERATED IN INCREMENTS OF 500MG  TO MAX 1,000 MG TWICE DAILY (Patient taking differently: Take 1,000 mg by mouth  2 (two) times daily with a meal. INCREASE MEDICATION AS TOLERATED IN INCREMENTS OF 500MG  TO MAX 1,000 MG TWICE DAILY) 180 tablet 1 Past Week   traZODone (DESYREL) 50 MG tablet Take 0.5-1 tablets (25-50 mg total) by mouth at bedtime as needed for sleep. 30 tablet 3 Past Month    Review of Systems Denies F/C/N/V/D  Blood pressure (!) 143/87, pulse 80, temperature 98.3 F (36.8 C), temperature source Oral, resp. rate 18, height 5\' 3"  (1.6 m), weight 106.6 kg, SpO2 95%. Physical Exam  Lungs unlabored CV RRR Abdomen soft, NT Extremities no calf tenderness  Results for orders placed or performed during the hospital encounter of 10/11/23 (from the past 24 hour(s))  ABO/Rh     Status: None   Collection Time: 10/11/23 12:25 PM  Result Value Ref Range   ABO/RH(D)      O POS Performed at [facility], [address] [place], [place] [address]     No results found.  Assessment/Plan: 52yo PMP female with symptomatic pelvic relaxation desiring surgical management.  Pt is presenting for scheduled LAVH/BSO/A-P Colporrhaphy/Cystoscopy/Possible Laparotomy.  Questions have been answered, risks benefits and alternatives reviewed including but not limited to bleeding infection and injury and consent signed and witnessed.  Purcell Nails 10/11/2023, 2:20 PM

## 2023-10-11 NOTE — Op Note (Signed)
Preop Diagnosis: Symptomatic Pelvic Relaxation   Postop Diagnosis: Symptomatic Pelvic Relaxation   Procedure: 1.LAPAROSCOPIC ASSISTED VAGINAL HYSTERECTOMY 2.BILATERAL SALPINGO-OOPHORECTOMY 3.A-P COLPORRHAPHY 4.CYSTOSCOPY   Anesthesia: General    Attending: Dr. Osborn Coho    Assistant:  Hoover Browns, MD   Findings: Nl appearing uterus, cervix, bilateral fallopian tubes and rt ovarian cyst.  No endometriotic lesions noted.   Pathology: 1.uterus and cervix 2.bilateral fallopian tubes and ovaries    Fluids: 1800 cc   UOP: 600 cc   EBL: 400 cc   Complications: None   Procedure:  The patient was taken to the operating room after the risks, benefits, alternatives, complications, treatment options, and expected outcomes were discussed with the patient. The patient verbalized understanding, the patient concurred with the proposed plan and consent signed and witnessed. The patient was taken to the Operating Room and identified as Kristen Carney and the procedure verified as LAVH/BSO/A-P Colporrhaphy/Cystoscopy.  The patient was placed under general anesthesia per anesthesia staff, the patient was placed in modified dorsal lithotomy position and was prepped, draped, and catheterized in the normal, sterile fashion.  A Time Out was held and the above information confirmed.   The cervix was visualized and an intrauterine manipulator was placed.  A 10 mm umbilical incision was then performed. Veress needle was passed and pneumoperitoneum was established. A 10 mm trocar was advanced into the intraabdominal cavity, the laparoscope was introduced and findings as noted above.  Patient was placed in trendelenburg and marcaine injected in the LLQ and a 5 mm incision was made and 5 mm trocar advanced into the intraabdominal cavity.  The same was done in the suprapubic area and RLQ.   The ligasure was used to excise the right fallopian tube in a sequential fashion.  The fallopian tube was removed and  sent to pathology.  The same was done on the contralateral side.  The ligasure was then used to cauterize and cut the infundibulopelvic ligament down to the round ligament and bladder flap created on that side.  The same was done on the contralateral side.  Attention was then turned to the perineum after covering the abdominal trocars with a surgical towel.  The intrauterine manipulator was removed and weighted speculum placed.  The anterior lip of the cervix was grasped with a single tooth tenaculum and the cervix injected with dilute pitressin (20u in 50cc NS).  The cervix was then circumscribed with the bovie and anterior cul-de-sac entered without difficulty.  The posterior cul-de-sac was then entered as well and uterosacral ligament on the right clamped with the heaney clamp, cut and suture ligated.  The same was done on the contralateral side.  Weighted speculum was then removed and long weighted speculum placed in the vagina and in the posterior cul-de-sac.  In a sequential fashion, the paracervical tissue and parametrial tissue was clamped cut and suture ligated on the right and then on the contralateral side until the uterus was able to be removed.  A McCall culdoplasty stitch was placed and the cuff closed with figure of eight stitches of 0 vicryl in a horizontal fashion and then McCall stitch tied.  Cystoscopy was performed and bilateral ureters were noted to efflux without difficulty and no inadvertent bladder injuries were noted.   Attention was then returned to the abdomen after regowning and regloving.  The laparoscope was introduced at the umbilicus and inspection performed.  The abdomen was copiously irrigated.  Good hemostasis at all pedicles and the vaginal cuff were noted.  The suprapubic, left lower quadrant and right lower quadrant trocars were removed under direct visualization and pneumoperitoneum relieved.  The 10mm umbilical trocar was then removed under direct visualization and fascia  repaired with 0 vicryl.  The 10mm incision was reapproximated with 3-0 monocryl and dermabond applied to all incisions.  Attention was then returned to the perineum and a weighted speculum was placed in the patient's vagina and the anterior vaginal wall was retracted using Deaver retractors.  The anterior vaginal wall was injected with dilute pitressin and the anterior vaginal wall was then incised and dissected away from the underlying layer of tissue.  The cystocele was repaired with plication stitches.  The excess vaginal wall tissue was excised and repaired with interrupted stitches of 2-0 Vicryl.  Attention was then turned to the posterior vaginal wall where dilute Pitressin was injected. The posterior vaginal wall was incised and the underlying tissue was dissected away from the posterior vaginal wall. The rectocele was repaired using plication stitches of 2-0 Vicryl.  Excess posterior vaginal wall tissue was excised and the posterior vaginal wall repaired with 2-0 vicryl via a running interlocking stitch.  The perineum was repaired with 2-0 Vicryl via a subcuticular stitch.  The vagina was packed with estrogen-soaked packing.  The patient tolerated the procedure well and was awaiting return to the recovery room in good condition.      Sponge, instrument, lap and needle counts were correct.  The patient tolerated the procedure well and was awaiting extubation and transfer to the recovery room in stable condition.  I was present and scrubbed and the assistant was required due to complexity of anatomy.

## 2023-10-11 NOTE — Anesthesia Procedure Notes (Addendum)
Procedure Name: Intubation Date/Time: 10/11/2023 3:24 PM  Performed by: Thomasene Ripple, CRNAPre-anesthesia Checklist: Patient identified, Emergency Drugs available, Suction available and Patient being monitored Patient Re-evaluated:Patient Re-evaluated prior to induction Oxygen Delivery Method: Circle System Utilized Preoxygenation: Pre-oxygenation with 100% oxygen Induction Type: IV induction Ventilation: Mask ventilation without difficulty Laryngoscope Size: Miller and 2 Grade View: Grade I Tube type: Oral Tube size: 7.0 mm Number of attempts: 1 Airway Equipment and Method: Stylet and Oral airway Placement Confirmation: ETT inserted through vocal cords under direct vision, positive ETCO2 and breath sounds checked- equal and bilateral Secured at: 23 cm Tube secured with: Tape Dental Injury: Teeth and Oropharynx as per pre-operative assessment  Comments: Placed by Adrienne Mocha

## 2023-10-12 ENCOUNTER — Encounter (HOSPITAL_COMMUNITY): Payer: Self-pay | Admitting: Obstetrics and Gynecology

## 2023-10-12 DIAGNOSIS — N8189 Other female genital prolapse: Secondary | ICD-10-CM | POA: Diagnosis not present

## 2023-10-12 LAB — BASIC METABOLIC PANEL
Anion gap: 7 (ref 5–15)
BUN: 12 mg/dL (ref 6–20)
CO2: 25 mmol/L (ref 22–32)
Calcium: 8.7 mg/dL — ABNORMAL LOW (ref 8.9–10.3)
Chloride: 105 mmol/L (ref 98–111)
Creatinine, Ser: 0.75 mg/dL (ref 0.44–1.00)
GFR, Estimated: >60 mL/min (ref >60)
Glucose, Bld: 157 mg/dL — ABNORMAL HIGH (ref 70–99)
Potassium: 5.2 mmol/L — ABNORMAL HIGH (ref 3.5–5.1)
Sodium: 137 mmol/L (ref 135–145)

## 2023-10-12 LAB — CBC
HCT: 35.5 % — ABNORMAL LOW (ref 36.0–46.0)
Hemoglobin: 11.7 g/dL — ABNORMAL LOW (ref 12.0–15.0)
MCH: 30.4 pg (ref 26.0–34.0)
MCHC: 33 g/dL (ref 30.0–36.0)
MCV: 92.2 fL (ref 80.0–100.0)
Platelets: 309 10*3/uL (ref 150–400)
RBC: 3.85 MIL/uL — ABNORMAL LOW (ref 3.87–5.11)
RDW: 12.5 % (ref 11.5–15.5)
WBC: 11.1 10*3/uL — ABNORMAL HIGH (ref 4.0–10.5)
nRBC: 0 % (ref 0.0–0.2)

## 2023-10-12 LAB — GLUCOSE, CAPILLARY: Glucose-Capillary: 135 mg/dL — ABNORMAL HIGH (ref 70–99)

## 2023-10-12 MED ORDER — IBUPROFEN 600 MG PO TABS: 600.0000 mg | ORAL_TABLET | Freq: Four times a day (QID) | ORAL | 1 refills | Status: AC | PRN

## 2023-10-12 MED ORDER — OXYCODONE HCL 5 MG PO TABS: 5.0000 mg | ORAL_TABLET | ORAL | 0 refills | Status: AC | PRN

## 2023-10-12 MED ORDER — BENZOCAINE-MENTHOL 20-0.5 % EX AERO: 1.0000 | INHALATION_SPRAY | Freq: Four times a day (QID) | CUTANEOUS | 0 refills | Status: AC | PRN

## 2023-10-12 MED ORDER — LACTATED RINGERS IV BOLUS
500.0000 mL | Freq: Once | INTRAVENOUS | Status: AC
  Administered 2023-10-12: 500 mL via INTRAVENOUS

## 2023-10-12 MED ORDER — LIDOCAINE 5 % EX OINT: 1.0000 | TOPICAL_OINTMENT | CUTANEOUS | 0 refills | Status: AC | PRN

## 2023-10-12 MED ORDER — SENNOSIDES-DOCUSATE SODIUM 8.6-50 MG PO TABS: 1.0000 | ORAL_TABLET | Freq: Every day | ORAL | 1 refills | Status: AC

## 2023-10-12 NOTE — Discharge Summary (Signed)
Physician Discharge Summary  Patient ID: Kristen Carney MRN: [id number] DOB/AGE: [birth year] 52 y.o.  Admit date: 10/11/2023 Discharge date: 10/12/2023  Admission Diagnoses: Symptomatic Pelvic Relaxation  Discharge Diagnoses:  Principal Problem:   S/P laparoscopic assisted vaginal hysterectomy (LAVH) BSO, A-P Repair, Cystoscopy  Discharged Condition: good  Hospital Course: Uneventful surgery with routine post op course discharged on POD 1.  Consults: None  Significant Diagnostic Studies: cbc and bmp  Treatments: surgery: as above  Discharge Exam: Blood pressure 120/60, pulse 86, temperature 98 F (36.7 C), resp. rate 17, height 5\' 3"  (1.6 m), weight 106.6 kg, SpO2 98%. General appearance: alert, cooperative, and no distress Resp: clear to auscultation bilaterally Cardio: regular rate and rhythm Extremities: no calf tenderness  Disposition: Discharge disposition: 01-Home or Self Care        Allergies as of 10/12/2023       Reactions   Amlodipine Swelling   Lower leg edema        Medication List     TAKE these medications    acetaminophen 500 MG tablet Commonly known as: TYLENOL Take 1,000 mg by mouth every 6 (six) hours as needed for moderate pain (pain score 4-6).   acidophilus Caps capsule Take 1 capsule by mouth in the morning and at bedtime.   benzocaine-Menthol 20-0.5 % Aero Commonly known as: DERMOPLAST Apply 1 Application topically 4 (four) times daily as needed for irritation.   escitalopram 20 MG tablet Commonly known as: LEXAPRO TAKE 1 TABLET DAILY   hydrochlorothiazide 12.5 MG tablet Commonly known as: HYDRODIURIL Take 1 tablet (12.5 mg total) by mouth daily.   ibuprofen 600 MG tablet Commonly known as: ADVIL Take 1 tablet (600 mg total) by mouth every 6 (six) hours as needed for mild pain (pain score 1-3), moderate pain (pain score 4-6) or cramping.   lidocaine 5 % ointment Commonly known as: XYLOCAINE Apply 1 Application  topically as needed.   losartan 50 MG tablet Commonly known as: COZAAR TAKE 1 TABLET AT BEDTIME   metFORMIN 500 MG 24 hr tablet Commonly known as: GLUCOPHAGE-XR INCREASE MEDICATION AS TOLERATED IN INCREMENTS OF 500MG  TO MAX 1,000 MG TWICE DAILY What changed: See the new instructions.   oxyCODONE 5 MG immediate release tablet Commonly known as: Oxy IR/ROXICODONE Take 1-2 tablets (5-10 mg total) by mouth every 4 (four) hours as needed for moderate pain (pain score 4-6).   senna-docusate 8.6-50 MG tablet Commonly known as: Senokot-S Take 1 tablet by mouth daily.   traZODone 50 MG tablet Commonly known as: DESYREL Take 0.5-1 tablets (25-50 mg total) by mouth at bedtime as needed for sleep.        Follow-up Information     [facility]. Go on 11/24/2023.   Specialty: Obstetrics and Gynecology Why: You have been scheduled for a post op appointment on 11/24/23 at 8:45am.  Please call office if this appointment needs to be rescheduled. Contact information: [address] [place] [address] [phone]                Signed: Purcell Nails 10/12/2023, 3:16 PM

## 2023-10-13 LAB — SURGICAL PATHOLOGY

## 2023-10-14 NOTE — Anesthesia Postprocedure Evaluation (Signed)
Anesthesia Post Note  Patient: Kristen Carney  Procedure(s) Performed: LAPAROSCOPIC ASSISTED VAGINAL HYSTERECTOMY WITH SALPINGO OOPHORECTOMY (Bilateral) ANTERIOR (CYSTOCELE) AND POSTERIOR REPAIR (RECTOCELE) CYSTOSCOPY (Vagina )     Patient location during evaluation: PACU Anesthesia Type: General Level of consciousness: awake and alert Pain management: pain level controlled Vital Signs Assessment: post-procedure vital signs reviewed and stable Respiratory status: spontaneous breathing, nonlabored ventilation, respiratory function stable and patient connected to nasal cannula oxygen Cardiovascular status: blood pressure returned to baseline and stable Postop Assessment: no apparent nausea or vomiting Anesthetic complications: no   No notable events documented.           Starlyn Droge

## 2023-12-28 ENCOUNTER — Other Ambulatory Visit: Payer: Self-pay | Admitting: Student

## 2024-01-20 ENCOUNTER — Other Ambulatory Visit: Payer: Self-pay | Admitting: Student

## 2024-01-20 DIAGNOSIS — R739 Hyperglycemia, unspecified: Secondary | ICD-10-CM

## 2024-04-18 ENCOUNTER — Encounter: Payer: Self-pay | Admitting: *Deleted

## 2024-08-28 ENCOUNTER — Other Ambulatory Visit: Payer: Self-pay

## 2024-08-28 MED ORDER — LOSARTAN POTASSIUM 50 MG PO TABS: 50.0000 mg | ORAL_TABLET | Freq: Every day | ORAL | 0 refills | Status: AC
